# Patient Record
Sex: Female | Born: 1970 | Race: White | Hispanic: No | Marital: Married | Smoking: Never smoker
Health system: Southern US, Community
[De-identification: ages and names within clinical notes are randomized; demographics above are authoritative.]

## PROBLEM LIST (undated history)

## (undated) DIAGNOSIS — R112 Nausea with vomiting, unspecified: Secondary | ICD-10-CM

## (undated) DIAGNOSIS — D6800 Von Willebrand disease, unspecified: Secondary | ICD-10-CM

## (undated) DIAGNOSIS — Z9889 Other specified postprocedural states: Secondary | ICD-10-CM

## (undated) DIAGNOSIS — T7840XA Allergy, unspecified, initial encounter: Secondary | ICD-10-CM

## (undated) DIAGNOSIS — N3010 Interstitial cystitis (chronic) without hematuria: Secondary | ICD-10-CM

## (undated) DIAGNOSIS — E669 Obesity, unspecified: Secondary | ICD-10-CM

## (undated) DIAGNOSIS — K8020 Calculus of gallbladder without cholecystitis without obstruction: Secondary | ICD-10-CM

## (undated) DIAGNOSIS — M1990 Unspecified osteoarthritis, unspecified site: Secondary | ICD-10-CM

## (undated) DIAGNOSIS — J45909 Unspecified asthma, uncomplicated: Secondary | ICD-10-CM

## (undated) DIAGNOSIS — C801 Malignant (primary) neoplasm, unspecified: Secondary | ICD-10-CM

## (undated) DIAGNOSIS — K859 Acute pancreatitis without necrosis or infection, unspecified: Secondary | ICD-10-CM

## (undated) DIAGNOSIS — I10 Essential (primary) hypertension: Secondary | ICD-10-CM

## (undated) DIAGNOSIS — D649 Anemia, unspecified: Secondary | ICD-10-CM

## (undated) DIAGNOSIS — J189 Pneumonia, unspecified organism: Secondary | ICD-10-CM

## (undated) DIAGNOSIS — K219 Gastro-esophageal reflux disease without esophagitis: Secondary | ICD-10-CM

## (undated) HISTORY — PX: TUBAL LIGATION: SHX77

## (undated) HISTORY — DX: Obesity, unspecified: E66.9

## (undated) HISTORY — DX: Anemia, unspecified: D64.9

## (undated) HISTORY — DX: Unspecified osteoarthritis, unspecified site: M19.90

## (undated) HISTORY — DX: Allergy, unspecified, initial encounter: T78.40XA

## (undated) HISTORY — DX: Interstitial cystitis (chronic) without hematuria: N30.10

## (undated) HISTORY — DX: Calculus of gallbladder without cholecystitis without obstruction: K80.20

## (undated) HISTORY — DX: Acute pancreatitis without necrosis or infection, unspecified: K85.90

## (undated) HISTORY — DX: Essential (primary) hypertension: I10

## (undated) HISTORY — PX: OTHER SURGICAL HISTORY: SHX169

## (undated) HISTORY — DX: Unspecified asthma, uncomplicated: J45.909

## (undated) HISTORY — PX: CHOLECYSTECTOMY: SHX55

## (undated) HISTORY — PX: MELANOMA EXCISION: SHX5266

---

## 1998-03-15 ENCOUNTER — Other Ambulatory Visit: Admission: RE | Admit: 1998-03-15 | Discharge: 1998-03-15 | Payer: Self-pay | Admitting: Obstetrics and Gynecology

## 1999-03-27 ENCOUNTER — Other Ambulatory Visit: Admission: RE | Admit: 1999-03-27 | Discharge: 1999-03-27 | Payer: Self-pay | Admitting: Obstetrics and Gynecology

## 2000-05-29 ENCOUNTER — Other Ambulatory Visit: Admission: RE | Admit: 2000-05-29 | Discharge: 2000-05-29 | Payer: Self-pay | Admitting: Obstetrics and Gynecology

## 2001-06-01 ENCOUNTER — Other Ambulatory Visit: Admission: RE | Admit: 2001-06-01 | Discharge: 2001-06-01 | Payer: Self-pay | Admitting: Obstetrics and Gynecology

## 2002-06-23 ENCOUNTER — Other Ambulatory Visit: Admission: RE | Admit: 2002-06-23 | Discharge: 2002-06-23 | Payer: Self-pay | Admitting: Obstetrics and Gynecology

## 2003-07-04 ENCOUNTER — Other Ambulatory Visit: Admission: RE | Admit: 2003-07-04 | Discharge: 2003-07-04 | Payer: Self-pay | Admitting: Obstetrics and Gynecology

## 2003-10-12 ENCOUNTER — Other Ambulatory Visit: Admission: RE | Admit: 2003-10-12 | Discharge: 2003-10-12 | Payer: Self-pay | Admitting: Obstetrics and Gynecology

## 2004-08-15 ENCOUNTER — Inpatient Hospital Stay (HOSPITAL_COMMUNITY): Admission: AD | Admit: 2004-08-15 | Discharge: 2004-08-15 | Payer: Self-pay | Admitting: Obstetrics and Gynecology

## 2004-08-21 ENCOUNTER — Other Ambulatory Visit: Admission: RE | Admit: 2004-08-21 | Discharge: 2004-08-21 | Payer: Self-pay | Admitting: Obstetrics and Gynecology

## 2004-12-28 ENCOUNTER — Ambulatory Visit (HOSPITAL_COMMUNITY): Admission: RE | Admit: 2004-12-28 | Discharge: 2004-12-28 | Payer: Self-pay | Admitting: Obstetrics and Gynecology

## 2005-03-12 ENCOUNTER — Inpatient Hospital Stay (HOSPITAL_COMMUNITY): Admission: AD | Admit: 2005-03-12 | Discharge: 2005-03-15 | Payer: Self-pay | Admitting: Obstetrics and Gynecology

## 2005-04-29 ENCOUNTER — Other Ambulatory Visit: Admission: RE | Admit: 2005-04-29 | Discharge: 2005-04-29 | Payer: Self-pay | Admitting: Obstetrics and Gynecology

## 2006-07-25 ENCOUNTER — Encounter: Admission: RE | Admit: 2006-07-25 | Discharge: 2006-07-25 | Payer: Self-pay | Admitting: Obstetrics and Gynecology

## 2006-08-26 ENCOUNTER — Encounter: Admission: RE | Admit: 2006-08-26 | Discharge: 2006-08-26 | Payer: Self-pay | Admitting: Obstetrics and Gynecology

## 2007-08-07 ENCOUNTER — Encounter: Admission: RE | Admit: 2007-08-07 | Discharge: 2007-08-07 | Payer: Self-pay | Admitting: Obstetrics and Gynecology

## 2007-08-14 ENCOUNTER — Encounter: Admission: RE | Admit: 2007-08-14 | Discharge: 2007-08-14 | Payer: Self-pay | Admitting: Obstetrics and Gynecology

## 2008-08-19 ENCOUNTER — Encounter: Admission: RE | Admit: 2008-08-19 | Discharge: 2008-08-19 | Payer: Self-pay | Admitting: Obstetrics and Gynecology

## 2009-02-16 ENCOUNTER — Encounter (INDEPENDENT_AMBULATORY_CARE_PROVIDER_SITE_OTHER): Payer: Self-pay | Admitting: Obstetrics and Gynecology

## 2009-02-16 ENCOUNTER — Ambulatory Visit (HOSPITAL_COMMUNITY): Admission: RE | Admit: 2009-02-16 | Discharge: 2009-02-16 | Payer: Self-pay | Admitting: Obstetrics and Gynecology

## 2009-09-12 ENCOUNTER — Encounter: Admission: RE | Admit: 2009-09-12 | Discharge: 2009-09-12 | Payer: Self-pay | Admitting: Obstetrics and Gynecology

## 2009-09-19 ENCOUNTER — Encounter: Admission: RE | Admit: 2009-09-19 | Discharge: 2009-09-19 | Payer: Self-pay | Admitting: Obstetrics and Gynecology

## 2010-03-23 ENCOUNTER — Encounter: Admission: RE | Admit: 2010-03-23 | Discharge: 2010-03-23 | Payer: Self-pay | Admitting: Obstetrics and Gynecology

## 2010-07-08 ENCOUNTER — Encounter: Payer: Self-pay | Admitting: Obstetrics and Gynecology

## 2010-07-27 ENCOUNTER — Other Ambulatory Visit: Payer: Self-pay | Admitting: Obstetrics and Gynecology

## 2010-07-27 DIAGNOSIS — Z1231 Encounter for screening mammogram for malignant neoplasm of breast: Secondary | ICD-10-CM

## 2010-09-14 ENCOUNTER — Ambulatory Visit
Admission: RE | Admit: 2010-09-14 | Discharge: 2010-09-14 | Disposition: A | Discharge status: Home / Self Care | Payer: PRIVATE HEALTH INSURANCE | Source: Ambulatory Visit | Attending: Obstetrics and Gynecology | Admitting: Obstetrics and Gynecology

## 2010-09-14 DIAGNOSIS — Z1231 Encounter for screening mammogram for malignant neoplasm of breast: Secondary | ICD-10-CM

## 2010-09-21 LAB — CBC
MCV: 86.1 fL (ref 78.0–100.0)
RBC: 5.08 MIL/uL (ref 3.87–5.11)
WBC: 6.9 10*3/uL (ref 4.0–10.5)

## 2010-10-30 NOTE — Op Note (Signed)
Ann Ramos, Ann Ramos                 ACCOUNT NO.:  [id number]   MEDICAL RECORD NO.:  [id number]          PATIENT TYPE:  AMB   LOCATION:  SDC                           FACILITY:  [facility]   PHYSICIAN:  Dineen Kid. Rana Snare, M.D.    DATE OF BIRTH:  06/02/1971   DATE OF PROCEDURE:  02/16/2009  DATE OF DISCHARGE:                               OPERATIVE REPORT   PREOPERATIVE DIAGNOSES:  Menorrhagia, multiparity with desired  sterility.   POSTOPERATIVE DIAGNOSES:  Menorrhagia, multiparity with desired  sterility.   PROCEDURES:  Intrauterine device removal, laparoscopic tubal ligation  with Filshie clips, hysteroscopy, dilatation and curettage with  polypectomy and NovaSure endometrial ablation.   SURGEON:  Dineen Kid. Rana Snare, MD   ANESTHESIA:  General endotracheal.   INDICATIONS:  Ann Ramos is a 40 year old multipara white female with no  further childbearing desires, currently with an IUD.  She has had  histories of menorrhagia and recurrent endometrial polyps.  Currently,  she has a IUD and is requiring 2 a day birth control pills to control  her bleeding.  She desires definitive surgical intervention and requests  tubal ligation and NovaSure endometrial ablation.  Also once her IUD  removed, an evaluation of the endometrial lining and the risks and  benefits of procedure were discussed at length which include, but not  limited to risk of infection, bleeding, damage to the uterus, tubes,  ovary, bowel, bladder and the possibility of the bleeding may not  improve, it could recur or worsen, risk of tubal failure quoted at  10/998.  We also discussed the reported success and complication rates  and failure rates of the NovaSure ablation.  She gives her informed  consent and wished to proceed.   FINDINGS AT THE TIME OF SURGERY:  Normal-appearing uterus, fallopian  tubes and ovaries, normal-appearing liver.  On hysteroscopy, we have a  endometrial polyp.  Otherwise normal appearing endometrial  cavity.   DESCRIPTION OF PROCEDURE:  After adequate analgesia, the patient was  placed in the dorsal lithotomy position.  She was sterilely prepped and  draped.  Bladder was sterilely drained.  IUD string was easily grasped.  The IUD was easily removed.  A Hulka tenaculum was placed in the cervix.  A 1 cm infraumbilical skin incision was made.  A Veress needle was  inserted.  The abdomen was insufflated with dullness to percussion.  An  11-mm trocar was inserted.  The above findings were noted by the  laparoscope.  The right and left fallopian tubes were identified by the  fimbriated end and Filshie clip was placed across the midportion of the  fallopian tubes with good placement noted across the entirety of the  tube.  Reexamination of both fallopian tubes revealed good placement  with complete occlusion of the fallopian tubes.  The abdomen was then  desufflated.  Trocars removed.  The infraumbilical skin incision was  closed with a 0 Vicryl interrupted suture and the fascia, 3-0 Vicryl  Rapide subcuticular suture.  The incision was then injected with 0.25%  Marcaine, total of 10 mL used.  The legs  were repositioned.  Speculum  was placed in the vagina.  The Hulka tenaculum was removed.  A single-  tooth tenaculum was placed on the anterior lip of the cervix.  The  cervix was dilated to a #27 Pratt dilator.  Hysteroscope was inserted  and the above findings were noted.  Polyp forceps were used to grasp and  remove the endometrial polyp, followed by gentle sharp curettage.  Reexamination of the hysteroscope revealed normal-appearing cavity.  The  NovaSure device was inserted with a cavity length of 5.5 cm, cervical  length of 3.5 cm, a cavity width of 4.3 cm, easily passed a cavity  assessment test.  The NovaSure device has been activated for 1 minute  and 2 seconds at a power of 130 watts.  The device was then removed.  Reexamination with the hysteroscope revealed good thermal cautery   effect.  No obvious complications or injuries noted.  The device was  then removed.  Tenaculum removed from the cervix.  The patient was then  transferred to recovery room in stable condition.  Sponge and instrument  counts were normal x3.  Estimated blood loss was minimal.  Saline  deficit 100 mL.  The patient received 1 g of cefotetan preoperatively  and 30 mg Toradol postoperatively.   DISPOSITION:  The patient will be discharged home and follow up in the  office in 2-3 weeks.  Sent home with a routine instruction sheet for D  and C, NovaSure and laparoscopy.  I told to return with problems of  increased pain, fever or bleeding.  Also sent home with a prescription  for Tylox #20.      Dineen Kid Rana Snare, M.D.  Electronically Signed     DCL/MEDQ  D:  02/16/2009  T:  02/16/2009  Job:  [id number]

## 2011-08-19 ENCOUNTER — Other Ambulatory Visit: Payer: Self-pay | Admitting: Obstetrics and Gynecology

## 2011-08-19 DIAGNOSIS — Z1231 Encounter for screening mammogram for malignant neoplasm of breast: Secondary | ICD-10-CM

## 2011-11-19 ENCOUNTER — Ambulatory Visit
Admission: RE | Admit: 2011-11-19 | Discharge: 2011-11-19 | Disposition: A | Discharge status: Home / Self Care | Payer: PRIVATE HEALTH INSURANCE | Source: Ambulatory Visit | Attending: Obstetrics and Gynecology | Admitting: Obstetrics and Gynecology

## 2011-11-19 DIAGNOSIS — Z1231 Encounter for screening mammogram for malignant neoplasm of breast: Secondary | ICD-10-CM

## 2012-04-03 ENCOUNTER — Telehealth: Payer: Self-pay | Admitting: Hematology and Oncology

## 2012-04-03 NOTE — Telephone Encounter (Signed)
C/D 04/03/12 for appt.04/29/12

## 2012-04-29 ENCOUNTER — Ambulatory Visit: Payer: PRIVATE HEALTH INSURANCE

## 2012-04-29 ENCOUNTER — Ambulatory Visit (HOSPITAL_BASED_OUTPATIENT_CLINIC_OR_DEPARTMENT_OTHER): Payer: PRIVATE HEALTH INSURANCE | Admitting: Lab

## 2012-04-29 ENCOUNTER — Telehealth: Payer: Self-pay | Admitting: Hematology and Oncology

## 2012-04-29 ENCOUNTER — Encounter: Payer: Self-pay | Admitting: Hematology and Oncology

## 2012-04-29 ENCOUNTER — Ambulatory Visit (HOSPITAL_BASED_OUTPATIENT_CLINIC_OR_DEPARTMENT_OTHER): Payer: PRIVATE HEALTH INSURANCE | Admitting: Hematology and Oncology

## 2012-04-29 VITALS — BP 126/82 | HR 97 | Temp 98.6°F | Resp 20 | Ht 64.5 in | Wt 168.4 lb

## 2012-04-29 DIAGNOSIS — D699 Hemorrhagic condition, unspecified: Secondary | ICD-10-CM | POA: Insufficient documentation

## 2012-04-29 LAB — CBC WITH DIFFERENTIAL/PLATELET
Eosinophils Absolute: 0.1 10*3/uL (ref 0.0–0.5)
HCT: 42.3 % (ref 34.8–46.6)
LYMPH%: 30 % (ref 14.0–49.7)
MONO#: 0.4 10*3/uL (ref 0.1–0.9)
NEUT#: 4.6 10*3/uL (ref 1.5–6.5)
NEUT%: 63.6 % (ref 38.4–76.8)
Platelets: 193 10*3/uL (ref 145–400)
WBC: 7.3 10*3/uL (ref 3.9–10.3)

## 2012-04-29 NOTE — Progress Notes (Signed)
Checked in new pt w/ no financial concerns. °

## 2012-04-29 NOTE — Patient Instructions (Addendum)
Ann Ramos  [id number]   [facility] - AFTER VISIT SUMMARY   **RECOMMENDATIONS MADE BY THE CONSULTANT AND ANY TEST    RESULTS WILL BE SENT TO YOUR REFERRING DOCTORS.   YOUR EXAM FINDINGS, LABS AND RESULTS WERE DISCUSSED BY YOUR MD TODAY.  YOU CAN GO TO THE [facility] WEB SITE FOR INSTRUCTIONS ON HOW TO ASSESS MY CHART FOR ADDITIONAL INFORMATION AS NEEDED.  Your Updated drug allergies are: Allergies as of 04/29/2012  . (Not on File)    Your current list of medications are: Current Outpatient Prescriptions  Medication Sig Dispense Refill  . doxycycline (ADOXA) 100 MG tablet Take 100 mg by mouth daily.      . fexofenadine-pseudoephedrine (ALLEGRA-D 24) 180-240 MG per 24 hr tablet Take 1 tablet by mouth as needed.      Marland Kitchen lisinopril (PRINIVIL,ZESTRIL) 20 MG tablet Take 20 mg by mouth daily.      . mometasone (NASONEX) 50 MCG/ACT nasal spray Place 2 sprays into the nose daily.         INSTRUCTIONS GIVEN AND DISCUSSED:  See attached schedule   SPECIAL INSTRUCTIONS/FOLLOW-UP:  See above.  I acknowledge that I have been informed and understand all the instructions given to me and received a copy.I know to contact the clinic, my physician, or go to the emergency Department if any problems should occur.   I do not have any more questions at this time, but understand that I may call the [facility] at [phone] during business hours should I have any further questions or need assistance in obtaining follow-up care.

## 2012-04-29 NOTE — Progress Notes (Signed)
OB/GYN MD: Dr. Rana Snare  Primary MD: Dr. Sherral Hammers [facility])  Ok to release information to:  Emera Spath (spouse) Dion Saucier (mother)  Ok to leave voicemail messages.  Preferred contact number: [phone] (cell)

## 2012-04-29 NOTE — Telephone Encounter (Signed)
gv and printed pt appt schedule for Dec °

## 2012-04-29 NOTE — Progress Notes (Signed)
This office note has been dictated.

## 2012-04-30 NOTE — Progress Notes (Signed)
CC:   Dineen Kid. Rana Snare, M.D.  IDENTIFYING STATEMENT:  The patient is a 41 year old woman seen at the request of Dr. Rana Snare with a bleeding diathesis.  HISTORY OF PRESENT ILLNESS:  The patient reports that over the years she has had heavy menstrual cycles, even despite receiving an endometrial ablation 2 years ago.  She is presently on the oral contraceptive pill to help control bleeding.  She also recalls that she had bleeding following wisdom tooth extraction at age 14.  She has a gallbladder surgery done laparoscopically but noted minimal bleeding at that time.  She bruises easily.  Her grandmother was noted to be a bleeder.  She describes her grandmother bleeding quite heavily following a hysterectomy.  Her brother suffers from nosebleeds.  Neither have been worked up for a bleeding diathesis.  PAST MEDICAL HISTORY: 1. Hypertension. 2. Status post endometrial ablation for menorrhagia. 3. Status post laparoscopic cholecystectomy in 1996. 4. History of acne rosacea. 5. Status post wisdom tooth extraction as a child.  ALLERGIES:  None.  MEDICATIONS:  Doxycycline 100 mg daily, Allegra-D 1 tablet as needed, lisinopril 20 mg daily, Nasonex 2 sprays daily.  ALLERGIES:  None.  SOCIAL HISTORY:  The patient is married with 2 children ages 59 and 34. Denies alcohol or tobacco use.  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  FAMILY HISTORY:  Patient's maternal grandmother was a bleeder.  Brother has a history of epistaxis.  Mother and maternal aunt both diagnosed with breast cancer.  HEALTH MAINTENANCE:  She is up-to-date with mammograms.  REVIEW OF SYSTEMS:  Denies fever, chills, night sweats, anorexia, weight loss.  GI:  Denies nausea, vomiting, abdominal pain, diarrhea, melena, hematochezia.  GU:  Denies dysuria, hematuria, nocturia, frequency. Skin:  No bruising or bleeding.  Musculoskeletal:  Denies joint aches, muscle pains.  Neurologic:  Denies  headaches, vision changes, extremity weakness.  The rest of the review of systems are negative.  PHYSICAL EXAM:  Patient is alert and oriented x3.  Vitals:  Pulse 97, blood pressure 124/82, temperature 98.6, respirations 20.  Weight 168.4 pounds.  HEENT:  Head is atraumatic, normocephalic.  Sclerae anicteric. Mouth moist.  Neck:  Supple.  Chest:  Clear to percussion and auscultation.  CVS:  First and second heart sounds present with no added sounds or murmurs.  Abdomen:  Soft, nontender.  No masses.  Bowel sounds present.  Extremities:  No edema.  Skin:  No bruising.  Lymph nodes:  No palpable adenopathy.  CNS:  Nonfocal.  IMPRESSION AND PLAN:  Mrs. Mcclary is a 41 year old woman who appears to have a bleeding diathesis with menorrhagia and also noted bleeding following wisdom tooth extraction at age 89.  She has also noted she bleeds easily.  She has a family history that is significant in a grandmother and a brother that has epistaxis.  She also reports a past history for anemia.  So with this said, she will have a bleeding diathesis workup performed.  We will also include a coagulation panel and a von Willebrand's panel.  It would also be worthwhile to obtain a CBC with differential and an anemia panel.  We spent most of the time discussing likely causes of bleeding diathesis. She returns to discuss results.  I spent more than half the time coordinating care.    ______________________________ Laurice Record, M.D. LIO/MEDQ  D:  04/29/2012  T:  04/30/2012  Job:  [id number]

## 2012-05-05 ENCOUNTER — Telehealth: Payer: Self-pay | Admitting: *Deleted

## 2012-05-05 ENCOUNTER — Other Ambulatory Visit: Payer: Self-pay | Admitting: Hematology and Oncology

## 2012-05-05 DIAGNOSIS — D699 Hemorrhagic condition, unspecified: Secondary | ICD-10-CM

## 2012-05-05 LAB — VON WILLEBRAND FACTOR MULTIMER

## 2012-05-05 LAB — IRON AND TIBC
Iron: 62 ug/dL (ref 42–145)
UIBC: 267 ug/dL (ref 125–400)

## 2012-05-05 NOTE — Telephone Encounter (Signed)
Spoke with pt and gave pt date and time for lab at 215 pm 05/06/12.

## 2012-05-06 ENCOUNTER — Other Ambulatory Visit (HOSPITAL_BASED_OUTPATIENT_CLINIC_OR_DEPARTMENT_OTHER): Payer: PRIVATE HEALTH INSURANCE | Admitting: Lab

## 2012-05-06 DIAGNOSIS — D699 Hemorrhagic condition, unspecified: Secondary | ICD-10-CM

## 2012-05-06 LAB — IVY BLEEDING TIME: Bleeding Time: 4.5 Minutes (ref 2.0–8.0)

## 2012-05-07 LAB — PLATELET FUNCTION ASSAY

## 2012-05-09 LAB — FACTOR 9 ASSAY: Coagulation Factor IX: 143 % — ABNORMAL HIGH (ref 75–134)

## 2012-05-21 ENCOUNTER — Encounter: Payer: Self-pay | Admitting: Hematology and Oncology

## 2012-05-21 ENCOUNTER — Telehealth: Payer: Self-pay | Admitting: Hematology and Oncology

## 2012-05-21 ENCOUNTER — Telehealth: Payer: Self-pay | Admitting: *Deleted

## 2012-05-21 ENCOUNTER — Ambulatory Visit (HOSPITAL_BASED_OUTPATIENT_CLINIC_OR_DEPARTMENT_OTHER): Payer: PRIVATE HEALTH INSURANCE | Admitting: Hematology and Oncology

## 2012-05-21 VITALS — BP 126/89 | HR 101 | Temp 97.6°F | Resp 20 | Ht 64.5 in | Wt 165.6 lb

## 2012-05-21 DIAGNOSIS — D699 Hemorrhagic condition, unspecified: Secondary | ICD-10-CM

## 2012-05-21 DIAGNOSIS — D680 Von Willebrand's disease: Secondary | ICD-10-CM

## 2012-05-21 NOTE — Patient Instructions (Addendum)
Ann Ramos  [id number]   [facility] - AFTER VISIT SUMMARY   **RECOMMENDATIONS MADE BY THE CONSULTANT AND ANY TEST    RESULTS WILL BE SENT TO YOUR REFERRING DOCTORS.   YOUR EXAM FINDINGS, LABS AND RESULTS WERE DISCUSSED BY YOUR MD TODAY.  YOU CAN GO TO THE [facility] WEB SITE FOR INSTRUCTIONS ON HOW TO ASSESS MY CHART FOR ADDITIONAL INFORMATION AS NEEDED.  Your Updated drug allergies are: Allergies as of 05/21/2012  . (Not on File)    Your current list of medications are: Current Outpatient Prescriptions  Medication Sig Dispense Refill  . doxycycline (ADOXA) 100 MG tablet Take 100 mg by mouth daily.      . fexofenadine-pseudoephedrine (ALLEGRA-D 24) 180-240 MG per 24 hr tablet Take 1 tablet by mouth as needed.      Marland Kitchen lisinopril (PRINIVIL,ZESTRIL) 20 MG tablet Take 20 mg by mouth daily.      . mometasone (NASONEX) 50 MCG/ACT nasal spray Place 2 sprays into the nose daily.         INSTRUCTIONS GIVEN AND DISCUSSED:  See attached schedule   SPECIAL INSTRUCTIONS/FOLLOW-UP:  See above.  I acknowledge that I have been informed and understand all the instructions given to me and received a copy.I know to contact the clinic, my physician, or go to the emergency Department if any problems should occur.   I do not have any more questions at this time, but understand that I may call the [facility] at [phone] during business hours should I have any further questions or need assistance in obtaining follow-up care.

## 2012-05-21 NOTE — Progress Notes (Signed)
This office note has been dictated.

## 2012-05-21 NOTE — Telephone Encounter (Signed)
Per staff and POF I have scheduled appt.   JMW

## 2012-05-21 NOTE — Telephone Encounter (Signed)
appts made and printed for pt aom °

## 2012-05-22 NOTE — Progress Notes (Signed)
CC:   Dineen Kid. Rana Snare, M.D. Keturah Barre, MD  HISTORY OF PRESENT ILLNESS:  The a 41 year old woman who presents to discuss results.  INTERVAL HISTORY:  To summarize, the patient was referred for a bleeding diathesis.  Had reported heavy menstrual cycles over the years, even despite receiving endometrial ablations.  She is currently on the oral contraceptive pill to help control bleeding.  Had recalled bleeding following wisdom tooth extraction.  She notes bruising easily.  Her grandmother was also a bleeder.  Her brother and nephew both have nose bleeds.  Results note the following:  04/29/2012 hemoglobin and hematocrit 14.6 and 42.3, respectively.  Von Willebrand factor was normal at 73%. Ristocetin cofactor normal at 53%.  Von Willebrand multimers were all present in normal amounts.  Bleeding time 4.5.  Ferritin 153.  PT 14.9, INR 1.18, PTT elevated at 40.  Factor VIII coagulation assay was low at 34.  MEDICATIONS:  Reviewed and updated.  ALLERGIES:  None.  PAST MEDICAL HISTORY/FAMILY HISTORY/SOCIAL HISTORY:  Unchanged.  REVIEW OF SYSTEMS:  Besides easy bruisability, the rest of review of systems negative.  PHYSICAL EXAM:  General:  The patient is a well-appearing, well- nourished woman in no distress.  Vitals:  Pulse 101, blood pressure 126/89, temperature 97.6, respirations 20, weight 165.6 pounds.  HEENT: Sclerae anicteric.  Mouth moist.  Chest:  Clear.  CVS:  Unremarkable. Abdomen:  Soft, nontender.  No masses.  Skin:  No bruising.  LABORATORY DATA:  As above.  In addition, white cell count 7.3, platelets 193.  IMPRESSION AND PLAN:  Ms Hemann is a 41 year old woman with history of a bleeding diathesis, and workup notes that she more than likely has Von Willebrand's disease, type 2N with a decreased coagulation factor VIII level with normal Von Willebrand antigen and multimers pattern.  She may probably benefit from DDAVP, but before I give a firm recommendation,  I would like for her to undergo DDAVP trial.  I have her returning next week.  She will receive 0.3 mg/kg IV, and we will obtain levels an hour and 4 hours after the infusion.  If she responds well, will prescribe intranasal DDAVP.  I also recommend that she have family members undergo testing.  I will be telephoning her with the results.    ______________________________ Laurice Record, M.D. LIO/MEDQ  D:  05/21/2012  T:  05/22/2012  Job:  [id number]

## 2012-05-26 ENCOUNTER — Other Ambulatory Visit: Payer: Self-pay | Admitting: *Deleted

## 2012-05-26 ENCOUNTER — Ambulatory Visit (HOSPITAL_BASED_OUTPATIENT_CLINIC_OR_DEPARTMENT_OTHER): Payer: PRIVATE HEALTH INSURANCE

## 2012-05-26 ENCOUNTER — Other Ambulatory Visit: Payer: PRIVATE HEALTH INSURANCE | Admitting: Lab

## 2012-05-26 ENCOUNTER — Telehealth: Payer: Self-pay | Admitting: *Deleted

## 2012-05-26 ENCOUNTER — Ambulatory Visit (HOSPITAL_BASED_OUTPATIENT_CLINIC_OR_DEPARTMENT_OTHER): Payer: PRIVATE HEALTH INSURANCE | Admitting: Lab

## 2012-05-26 VITALS — BP 105/72 | HR 105 | Temp 97.9°F

## 2012-05-26 DIAGNOSIS — D699 Hemorrhagic condition, unspecified: Secondary | ICD-10-CM

## 2012-05-26 DIAGNOSIS — D680 Von Willebrand's disease: Secondary | ICD-10-CM

## 2012-05-26 MED ORDER — SODIUM CHLORIDE 0.9 % IV SOLN
24.0000 ug | Freq: Once | INTRAVENOUS | Status: AC
  Administered 2012-05-26: 24 ug via INTRAVENOUS
  Filled 2012-05-26: qty 6

## 2012-05-26 MED ORDER — DIPHENHYDRAMINE HCL 50 MG/ML IJ SOLN
25.0000 mg | Freq: Once | INTRAMUSCULAR | Status: AC
  Administered 2012-05-26: 25 mg via INTRAVENOUS

## 2012-05-26 NOTE — Telephone Encounter (Signed)
Pt was assessed post lab draw for DDAVP.  Pt's face less flushed, eyes less reddened.  Pt stated she was feeling much better at 1600.   MD gave pt instructions.   NSL was discontinued by nurse with catheter intact, site unremarkable.  Nourishment given to pt.  Pt was stable at discharge via ambulation by self.

## 2012-05-26 NOTE — Patient Instructions (Signed)
Desmopressin injection What is this medicine? DESMOPRESSIN (des moe PRESS in) is a man made hormone. It is used to treat or prevent bleeding in patients with Hemophilia A and von Willebrand's Disease. This medicine is also used to reduce urine production in patients with diabetes insipidus. This medicine may be used for other purposes; ask your health care provider or pharmacist if you have questions. What should I tell my health care provider before I take this medicine? They need to know if you have any of these conditions: -blood clot in the past -cystic fibrosis -heart disease -high blood pressure -kidney disease -low levels of sodium in the blood -an unusual or allergic reaction to desmopressin, vasopressin, other medicines, foods, dyes, or preservatives -pregnant or trying to get pregnant -breast-feeding How should I use this medicine? This medicine is infused into a vein or injected under the skin. It is usually given by a health care professional in a hospital or clinic setting. If you use this medicine at home, you will be taught how to prepare and give this medicine. Use exactly as directed. Take your medicine at regular intervals. Do not take your medicine more often than directed. It is important that you put your used needles and syringes in a special sharps container. Do not put them in a trash can. If you do not have a sharps container, call your pharmacist or healthcare provider to get one. Talk to your pediatrician regarding the use of this medicine in children. While this drug may be prescribed for selected conditions, precautions do apply. Overdosage: If you think you have taken too much of this medicine contact a poison control center or emergency room at once. NOTE: This medicine is only for you. Do not share this medicine with others. What if I miss a dose? If you miss a dose and it is almost time for your next dose, take only that dose. Do not take double or extra  doses. What may interact with this medicine? -alcohol -demeclocycline -medicines for asthma, breathing problems, colds -medicines for low blood pressure This list may not describe all possible interactions. Give your health care provider a list of all the medicines, herbs, non-prescription drugs, or dietary supplements you use. Also tell them if you smoke, drink alcohol, or use illegal drugs. Some items may interact with your medicine. What should I watch for while using this medicine? Visit your doctor or health care professional for regular check ups. You will need to have blood work done while you are taking this medicine. Talk with your doctor about how many glasses of fluid you need to drink a day. Only drink enough fluid to satisfy your thirst or as directed. Too much or not enough water can cause harm. What side effects may I notice from receiving this medicine? Side effects that you should report to your doctor or health care professional as soon as possible: -allergic reactions like skin rash, itching or hives, swelling of the face, lips, or tongue -change in blood pressure -chest pain, tightness -confusion -difficulty breathing -fast heart rate -retaining water -seizure -sudden weight gain -unusual bleeding, bruising -unusually weak or tired Side effects that usually do not require medical attention (report to your doctor or health care professional if they continue or are bothersome): -diarrhea -flushing, reddening of the skin -headache -nausea, vomiting -pain, redness where injected -stomach cramps -vaginal area pain This list may not describe all possible side effects. Call your doctor for medical advice about side effects. You may report   side effects to FDA at 1-800-FDA-1088. Where should I keep my medicine? Keep out of the reach of children. You will be instructed on how to store this medicine. Throw away any unused medicine after the expiration date on the  label. NOTE: This sheet is a summary. It may not cover all possible information. If you have questions about this medicine, talk to your doctor, pharmacist, or health care provider.  2013, [employer]. (09/24/2007 4:31:00 PM)  

## 2012-05-26 NOTE — Progress Notes (Signed)
1140-DDAVP hung at 1115.  At 1140, facial flushing noted on patient.  Dr. Dalene Carrow notified and order received to give Benadryl 25 mg. IV.  Pt states that face feels "hot".  1245-Pt to labe for 1 hr post labs.  Pt verbalizes an understanding to remain in building until labs need to be drawn again at 1545.  Upon discharge from infusion room, pt.'s face still red, but "hot" sensation is gone.  Pt has no complaints at this time.  Dr. Dalene Carrow notified of pt.'s condition at time of discharge.

## 2012-05-29 LAB — VON WILLEBRAND PANEL
Coagulation Factor VIII: 128 % (ref 73–140)
Ristocetin Co-factor, Plasma: 204 % — ABNORMAL HIGH (ref 42–200)
Ristocetin Co-factor, Plasma: 190 % (ref 42–200)
Von Willebrand Antigen, Plasma: 122 % (ref 50–217)
Von Willebrand Antigen, Plasma: 202 % (ref 50–217)

## 2012-06-04 ENCOUNTER — Other Ambulatory Visit: Payer: Self-pay | Admitting: Hematology and Oncology

## 2012-06-04 DIAGNOSIS — D680 Von Willebrand's disease: Secondary | ICD-10-CM

## 2012-06-04 MED ORDER — DESMOPRESSIN ACETATE 1.5 MG/ML NA SOLN: 1.0000 | Freq: Once | NASAL | Status: DC

## 2012-07-07 ENCOUNTER — Ambulatory Visit: Payer: PRIVATE HEALTH INSURANCE | Admitting: Oncology

## 2012-07-22 ENCOUNTER — Telehealth: Payer: Self-pay | Admitting: Hematology and Oncology

## 2012-07-22 NOTE — Telephone Encounter (Signed)
LVOM for pt in re to appt 2/28 @ 10:15 @ [employer]

## 2012-11-13 ENCOUNTER — Other Ambulatory Visit: Payer: Self-pay

## 2012-11-13 DIAGNOSIS — Z1231 Encounter for screening mammogram for malignant neoplasm of breast: Secondary | ICD-10-CM

## 2012-12-29 ENCOUNTER — Ambulatory Visit
Admission: RE | Admit: 2012-12-29 | Discharge: 2012-12-29 | Disposition: A | Discharge status: Home / Self Care | Payer: PRIVATE HEALTH INSURANCE | Source: Ambulatory Visit

## 2012-12-29 DIAGNOSIS — Z1231 Encounter for screening mammogram for malignant neoplasm of breast: Secondary | ICD-10-CM

## 2012-12-31 ENCOUNTER — Other Ambulatory Visit: Payer: Self-pay | Admitting: Obstetrics and Gynecology

## 2012-12-31 DIAGNOSIS — R928 Other abnormal and inconclusive findings on diagnostic imaging of breast: Secondary | ICD-10-CM

## 2013-01-06 ENCOUNTER — Other Ambulatory Visit: Payer: Self-pay | Admitting: Obstetrics and Gynecology

## 2013-01-06 DIAGNOSIS — Z803 Family history of malignant neoplasm of breast: Secondary | ICD-10-CM

## 2013-01-12 ENCOUNTER — Ambulatory Visit
Admission: RE | Admit: 2013-01-12 | Discharge: 2013-01-12 | Disposition: A | Discharge status: Home / Self Care | Payer: PRIVATE HEALTH INSURANCE | Source: Ambulatory Visit | Attending: Obstetrics and Gynecology | Admitting: Obstetrics and Gynecology

## 2013-01-12 DIAGNOSIS — R928 Other abnormal and inconclusive findings on diagnostic imaging of breast: Secondary | ICD-10-CM

## 2013-01-20 ENCOUNTER — Other Ambulatory Visit: Payer: PRIVATE HEALTH INSURANCE

## 2013-06-07 ENCOUNTER — Other Ambulatory Visit: Payer: Self-pay | Admitting: Obstetrics and Gynecology

## 2013-06-07 DIAGNOSIS — N63 Unspecified lump in unspecified breast: Secondary | ICD-10-CM

## 2013-06-24 ENCOUNTER — Ambulatory Visit
Admission: RE | Admit: 2013-06-24 | Discharge: 2013-06-24 | Disposition: A | Discharge status: Home / Self Care | Payer: No Typology Code available for payment source | Source: Ambulatory Visit | Attending: Obstetrics and Gynecology | Admitting: Obstetrics and Gynecology

## 2013-06-24 DIAGNOSIS — N63 Unspecified lump in unspecified breast: Secondary | ICD-10-CM

## 2014-04-13 ENCOUNTER — Other Ambulatory Visit: Payer: Self-pay | Admitting: Obstetrics and Gynecology

## 2014-04-14 LAB — CYTOLOGY - PAP

## 2014-08-23 ENCOUNTER — Other Ambulatory Visit: Payer: Self-pay | Admitting: Dermatology

## 2015-01-19 ENCOUNTER — Other Ambulatory Visit: Payer: Self-pay | Admitting: Vascular Surgery

## 2015-01-23 ENCOUNTER — Other Ambulatory Visit: Payer: Self-pay | Admitting: *Deleted

## 2015-01-23 DIAGNOSIS — I7300 Raynaud's syndrome without gangrene: Secondary | ICD-10-CM

## 2015-01-23 DIAGNOSIS — M79641 Pain in right hand: Secondary | ICD-10-CM

## 2015-01-23 DIAGNOSIS — M79642 Pain in left hand: Secondary | ICD-10-CM

## 2015-01-23 DIAGNOSIS — R202 Paresthesia of skin: Secondary | ICD-10-CM

## 2015-03-16 ENCOUNTER — Encounter: Payer: Self-pay | Admitting: Surgery

## 2015-03-20 ENCOUNTER — Ambulatory Visit (INDEPENDENT_AMBULATORY_CARE_PROVIDER_SITE_OTHER): Payer: No Typology Code available for payment source | Admitting: Surgery

## 2015-03-20 ENCOUNTER — Ambulatory Visit (INDEPENDENT_AMBULATORY_CARE_PROVIDER_SITE_OTHER)
Admission: RE | Admit: 2015-03-20 | Discharge: 2015-03-20 | Disposition: A | Discharge status: Home / Self Care | Payer: No Typology Code available for payment source | Source: Ambulatory Visit | Attending: Surgery | Admitting: Surgery

## 2015-03-20 ENCOUNTER — Encounter: Payer: Self-pay | Admitting: Surgery

## 2015-03-20 ENCOUNTER — Ambulatory Visit (HOSPITAL_COMMUNITY)
Admission: RE | Admit: 2015-03-20 | Discharge: 2015-03-20 | Disposition: A | Discharge status: Home / Self Care | Payer: No Typology Code available for payment source | Source: Ambulatory Visit | Attending: Surgery | Admitting: Surgery

## 2015-03-20 VITALS — BP 131/86 | HR 83 | Temp 98.2°F | Resp 16 | Ht 65.5 in | Wt 151.0 lb

## 2015-03-20 DIAGNOSIS — M79642 Pain in left hand: Secondary | ICD-10-CM | POA: Insufficient documentation

## 2015-03-20 DIAGNOSIS — M79641 Pain in right hand: Secondary | ICD-10-CM | POA: Insufficient documentation

## 2015-03-20 DIAGNOSIS — I7300 Raynaud's syndrome without gangrene: Secondary | ICD-10-CM | POA: Insufficient documentation

## 2015-03-20 DIAGNOSIS — I10 Essential (primary) hypertension: Secondary | ICD-10-CM | POA: Diagnosis not present

## 2015-03-20 DIAGNOSIS — R202 Paresthesia of skin: Secondary | ICD-10-CM

## 2015-03-20 NOTE — Progress Notes (Signed)
Filed Vitals:   03/20/15 1202 03/20/15 1212  BP: 135/89 131/86  Pulse: 83 83  Temp: 98.2 F (36.8 C)   TempSrc: Oral   Resp: 16   Height: 5' 5.5" (1.664 m)   Weight: 151 lb (68.493 kg)   SpO2: 100%

## 2015-03-20 NOTE — Progress Notes (Signed)
Patient name: Ann Ramos MRN: [id number] DOB: [birth year] Sex: female   Referred by: Dr. Estanislado Pandy  Reason for referral:  Chief Complaint  Patient presents with  . New Evaluation    Ref. by Dr. Estanislado Pandy  C/O  Bilateral Blue hands/feet,  3 - 4 yrs, Bilat. Cramps Hand/Feet, 3 yrs.  Numbness Bilat. Hand/feet,     HISTORY OF PRESENT ILLNESS: This is a very pleasant 44 year old female who comes in today for further evaluation regarding Raynaud's phenomenon.  The patient states that she began having symptoms approximately 3-4 years ago.  There was no inciting event.  She complains of cramps which affect her bilateral lower extremities from the knee down and bilateral hands.  She does note bluish discoloration of both her hands and her feet which has become more pronounced.  She also gets tingling and a feeling of her hands being asleep.  She has undergone a full rheumatologic workup which has all been within normal limits.  This includes a CBC, sedimentation rate, CK, UA, ANCA, ACE level, rheumatoid factor, CCP, G6PD, SPEP, C3, C4, ENA, beta-2, anti-Cardiolite back, lupus anticoagulant, and cryoglobulin.  The patient is a nonsmoker.  She was recently started on a low-dose calcium channel blocker approximately 2 weeks ago which has not had significant benefit.  She has tried a full dose calcium channel blocker in the past and had significant bilateral lower extremity edema.  She is a nonsmoker.  She does not have a prior history nor a family history of autoimmune disease.  She is medically managed for arthritis and anemia.  Past Medical History  Diagnosis Date  . Allergy   . Anemia   . Arthritis   . Asthma   . Hypertension     Past Surgical History  Procedure Laterality Date  . Cholecystectomy    . Dilation and cutterege      polys  . Uterine ablation    . Tubal ligation      Social History   Social History  . Marital Status: Married    Spouse Name: N/A  . Number of  Children: N/A  . Years of Education: N/A   Occupational History  . Not on file.   Social History Main Topics  . Smoking status: Never Smoker   . Smokeless tobacco: Never Used  . Alcohol Use: Yes     Comment: Occasional alcohol  . Drug Use: No  . Sexual Activity: Not on file   Other Topics Concern  . Not on file   Social History Narrative    Family History  Problem Relation Age of Onset  . Cancer Mother     Breast   . Hyperlipidemia Mother   . Diabetes Father   . Hyperlipidemia Father   . Hypertension Father   . Varicose Veins Father     Allergies as of 03/20/2015  . (No Known Allergies)    Current Outpatient Prescriptions on File Prior to Visit  Medication Sig Dispense Refill  . fexofenadine-pseudoephedrine (ALLEGRA-D 24) 180-240 MG per 24 hr tablet Take 1 tablet by mouth as needed.    . phentermine (ADIPEX-P) 37.5 MG tablet Take 37.5 mg by mouth daily before breakfast.    . desmopressin (STIMATE) 1.5 MG/ML SOLN Place 1 spray (0.15 mg total) into the nose once. On the first day of each menstrual cycle. (Patient not taking: Reported on 03/20/2015) 1 Bottle 0  . doxycycline (ADOXA) 100 MG tablet Take 100 mg by mouth daily.    Marland Kitchen  lisinopril (PRINIVIL,ZESTRIL) 20 MG tablet Take 20 mg by mouth daily.    . mometasone (NASONEX) 50 MCG/ACT nasal spray Place 2 sprays into the nose daily. Both nostrils.     No current facility-administered medications on file prior to visit.     REVIEW OF SYSTEMS: Cardiovascular: No chest pain.  Positive for chest pressure and palpitations, pain in legs and walking and lying flat, leg swelling and varicose veins. Pulmonary: No productive cough, asthma or wheezing. Neurologic: Positive for numbness in both hands and feet No dizziness. Hematologic: Positive for bleeding problems Musculoskeletal: No joint pain or joint swelling. Gastrointestinal: No blood in stool or hematemesis Genitourinary: No dysuria or hematuria. Psychiatric:: No history  of major depression. Integumentary: No rashes or ulcers. Constitutional: No fever positive chills.  PHYSICAL EXAMINATION:  Filed Vitals:   03/20/15 1202 03/20/15 1212  BP: 135/89 131/86  Pulse: 83 83  Temp: 98.2 F (36.8 C)   TempSrc: Oral   Resp: 16   Height: 5' 5.5" (1.664 m)   Weight: 151 lb (68.493 kg)   SpO2: 100%    Body mass index is 24.74 kg/(m^2). General: The patient appears their stated age.   HEENT:  No gross abnormalities Pulmonary: Respirations are non-labored Musculoskeletal: There are no major deformities.   Neurologic: No focal weakness or paresthesias are detected, Skin: Profound bluish appearance of bilateral feet and bilateral hands Psychiatric: The patient has normal affect. Cardiovascular: There is a regular rate and rhythm without significant murmur appreciated.  No carotid bruits.  Palpable pedal pulses.  Palpable radial pulses.  Diagnostic Studies: Lower extremity Doppler studies were performed today.  Ankle brachial index on the right is 1.1.  On the left is 1.09.  Both with triphasic waveforms. Bilateral upper extremity and digital pressures.  There was mild dampening in bilateral digital PPG waveforms throughout the digits at rest and after warming   Assessment:  Raynaud's phenomenon Plan: Vascular lab testing as well as physical exam today does not show an obstructive component to her Raynaud's.  Therefore, I think she is going to be best managed medically.  She has already started a calcium channel blocker at low dose.  Hopefully this dose can be increased so that she might see some of the benefits.  From a vascular standpoint, there are no obstructive lesions that could be treated.  I think this is all in the smaller vessels which is dependent upon medical therapy.     Eldridge Abrahams, M.D. [facility] Office: [phone] Pager:  [phone]

## 2015-03-29 ENCOUNTER — Encounter: Payer: Self-pay | Admitting: Rheumatology

## 2016-05-23 ENCOUNTER — Ambulatory Visit: Payer: Self-pay | Admitting: Rheumatology

## 2016-06-26 NOTE — Progress Notes (Signed)
Office Visit Note  Patient: Ann Ramos             Date of Birth: 12/29/1970           MRN: [id number]             PCP: Luz Lex, MD Referring: Louretta Shorten, MD Visit Date: 06/27/2016 Occupation: Members service representative    Subjective:  Raynauds   History of Present Illness: Ann Ramos is a 46 y.o. female with history of Raynaud's phenomenon. She states some that she still continues to have some symptoms of Raynauds but on the medications. She denies any digital ulcers.  Activities of Daily Living:  Patient reports morning stiffness for0 minute.   Patient Denies nocturnal pain.  Difficulty dressing/grooming: Denies Difficulty climbing stairs: Denies Difficulty getting out of chair: Denies Difficulty using hands for taps, buttons, cutlery, and/or writing: Denies   Review of Systems  Constitutional: Negative for fatigue, night sweats, weight gain, weight loss and weakness.  HENT: Negative for mouth sores, trouble swallowing, trouble swallowing, mouth dryness and nose dryness.   Eyes: Negative for pain, redness, visual disturbance and dryness.  Respiratory: Negative for cough, shortness of breath and difficulty breathing.   Cardiovascular: Negative for chest pain, palpitations, hypertension, irregular heartbeat and swelling in legs/feet.  Gastrointestinal: Negative for blood in stool, constipation and diarrhea.  Endocrine: Negative for increased urination.  Genitourinary: Negative for vaginal dryness.  Musculoskeletal: Negative for arthralgias, joint pain, joint swelling, myalgias, muscle weakness, morning stiffness, muscle tenderness and myalgias.  Skin: Positive for color change. Negative for rash, hair loss, skin tightness, ulcers and sensitivity to sunlight.  Allergic/Immunologic: Negative for susceptible to infections.  Neurological: Negative for dizziness, memory loss and night sweats.  Hematological: Negative for swollen glands.  Psychiatric/Behavioral:  Negative for depressed mood and sleep disturbance. The patient is not nervous/anxious.     PMFS History:  Patient Active Problem List   Diagnosis Date Noted  . Essential hypertension 06/27/2016  . Environmental allergies 06/27/2016  . Rosacea 06/27/2016  . Raynaud's disease without gangrene 06/27/2016  . 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 Allergy   . Anemia   . Arthritis   . Asthma   . Hypertension     Family History  Problem Relation Age of Onset  . Cancer Mother     Breast   . Hyperlipidemia Mother   . Diabetes Father   . Hyperlipidemia Father   . Hypertension Father   . Varicose Veins Father    Past Surgical History:  Procedure Laterality Date  . CHOLECYSTECTOMY    . Dilation and Cutterege     polys  . TUBAL LIGATION    . uterine ablation     Social History   Social History Narrative  . No narrative on file     Objective: Vital Signs: BP 110/74   Pulse 82   Resp 14   Ht _0  (1.651 m)   Wt 175 lb (79.4 kg)   LMP 06/18/2016 (Exact Date)   BMI 29.12 kg/m    Physical Exam  Constitutional: She is oriented to person, place, and time. She appears well-developed and well-nourished.  HENT:  Head: Normocephalic and atraumatic.  Eyes: Conjunctivae and EOM are normal.  Neck: Normal range of motion.  Cardiovascular: Normal rate, regular rhythm, normal heart sounds and intact distal pulses.   Pulmonary/Chest: Effort normal and breath sounds normal.  Abdominal: Soft. Bowel sounds are normal.  Lymphadenopathy:    She has no cervical adenopathy.  Neurological: She is alert and oriented to person, place, and time.  Skin: Skin is warm and dry. Capillary refill takes less than 2 seconds.  Psychiatric: She has a normal mood and affect. Her behavior is normal.  Nursing note and vitals reviewed.    Musculoskeletal Exam: C-spine, thoracic, lumbar spine good range of motion.  Shoulder joints, elbow joints, wrist joints, MCPs, PIPs, DIPs good range of motion with no synovitis. Hip joints, knee joints, ankle joints, MTPs were good range of motion with no synovitis.  CDAI Exam: No CDAI exam completed.    Investigation: Findings:  10/03/2015 CBC normal, CMP normal, ESR negative, ENA negative, cryoglobulins negative, UA negative, 1, ANA negative, C3-C4 normal, beta-2 negative, anticardiolipin negative, lupus anticoagulant negative    Imaging: No results found.  Speciality Comments: No specialty comments available.    Procedures:  No procedures performed Allergies: Patient has no known allergies.   Assessment / Plan:     Visit Diagnoses: Raynaud's disease without gangrene - On losartan, sildenafil 20 mg twice a day she is doing much better on the current combination of medications. Her hands were warm to touch. She had no digital ulcers on examination. I do not notice any skin thickening she has no other clinical features of autoimmune disease. I will check some additional labs today. She's been complaining of myalgias also check CK.- Plan: CBC with Differential/Platelet, COMPLETE METABOLIC PANEL WITH GFR, Urinalysis, Routine w reflex microscopic, Rheumatoid factor, Cyclic citrul peptide antibody, IgG, Anti-DNA antibody, double-stranded, ANA, CK, Pan-ANCA  Von Willebrands disease ([facility]) - Avoid aspirin  Essential hypertension  Environmental allergies  Rosacea - Followed up by Dr. Derrel Nip    Orders: Orders Placed This Encounter  Procedures  . CBC with Differential/Platelet  . COMPLETE METABOLIC PANEL WITH GFR  . Urinalysis, Routine w reflex microscopic  . Rheumatoid factor  . Cyclic citrul peptide antibody, IgG  . Anti-DNA antibody, double-stranded  . ANA  . CK  . Pan-ANCA   Meds ordered this encounter  Medications  . sildenafil (REVATIO) 20 MG tablet    Sig: Take 1 tablet (20 mg total) by mouth 3 (three) times daily.    Dispense:  90  tablet    Refill:  5    Face-to-face time spent with patient was 30 minutes. 50% of time was spent in counseling and coordination of care.  Follow-Up Instructions: Return in about 1 year (around 06/27/2017) for Raynauds.   Bo Merino, MD  Note - This record has been created using Editor, commissioning.  Chart creation errors have been sought, but may not always  have been located. Such creation errors do not reflect on  the standard of medical care.

## 2016-06-27 ENCOUNTER — Ambulatory Visit (INDEPENDENT_AMBULATORY_CARE_PROVIDER_SITE_OTHER): Payer: No Typology Code available for payment source | Admitting: Rheumatology

## 2016-06-27 ENCOUNTER — Encounter: Payer: Self-pay | Admitting: Rheumatology

## 2016-06-27 VITALS — BP 110/74 | HR 82 | Resp 14 | Ht 65.0 in | Wt 175.0 lb

## 2016-06-27 DIAGNOSIS — Z9109 Other allergy status, other than to drugs and biological substances: Secondary | ICD-10-CM | POA: Diagnosis not present

## 2016-06-27 DIAGNOSIS — L719 Rosacea, unspecified: Secondary | ICD-10-CM

## 2016-06-27 DIAGNOSIS — I7300 Raynaud's syndrome without gangrene: Secondary | ICD-10-CM

## 2016-06-27 DIAGNOSIS — D6800 Von Willebrand disease, unspecified: Secondary | ICD-10-CM

## 2016-06-27 DIAGNOSIS — I10 Essential (primary) hypertension: Secondary | ICD-10-CM

## 2016-06-27 LAB — URINALYSIS, ROUTINE W REFLEX MICROSCOPIC
Bilirubin Urine: NEGATIVE
GLUCOSE, UA: NEGATIVE
HGB URINE DIPSTICK: NEGATIVE
Ketones, ur: NEGATIVE
LEUKOCYTES UA: NEGATIVE
Nitrite: NEGATIVE
PH: 6 (ref 5.0–8.0)
Protein, ur: NEGATIVE
Specific Gravity, Urine: 1.014 (ref 1.001–1.035)

## 2016-06-27 LAB — CBC WITH DIFFERENTIAL/PLATELET
BASOS PCT: 0 %
Basophils Absolute: 0 cells/uL (ref 0–200)
EOS PCT: 1 %
Eosinophils Absolute: 78 cells/uL (ref 15–500)
HCT: 42.7 % (ref 35.0–45.0)
Hemoglobin: 14.2 g/dL (ref 11.7–15.5)
LYMPHS ABS: 1872 {cells}/uL (ref 850–3900)
Lymphocytes Relative: 24 %
MCH: 29 pg (ref 27.0–33.0)
MCHC: 33.3 g/dL (ref 32.0–36.0)
MCV: 87.3 fL (ref 80.0–100.0)
MONOS PCT: 7 %
MPV: 10.9 fL (ref 7.5–12.5)
Monocytes Absolute: 546 cells/uL (ref 200–950)
NEUTROS ABS: 5304 {cells}/uL (ref 1500–7800)
Neutrophils Relative %: 68 %
Platelets: 221 10*3/uL (ref 140–400)
RBC: 4.89 MIL/uL (ref 3.80–5.10)
RDW: 13 % (ref 11.0–15.0)
WBC: 7.8 10*3/uL (ref 3.8–10.8)

## 2016-06-27 MED ORDER — SILDENAFIL CITRATE 20 MG PO TABS: 20.0000 mg | ORAL_TABLET | Freq: Three times a day (TID) | ORAL | 5 refills | Status: DC

## 2016-06-28 LAB — COMPLETE METABOLIC PANEL WITH GFR
ALT: 17 U/L (ref 6–29)
AST: 14 U/L (ref 10–30)
Albumin: 4.2 g/dL (ref 3.6–5.1)
Alkaline Phosphatase: 45 U/L (ref 33–115)
BILIRUBIN TOTAL: 0.4 mg/dL (ref 0.2–1.2)
BUN: 13 mg/dL (ref 7–25)
CO2: 22 mmol/L (ref 20–31)
CREATININE: 0.62 mg/dL (ref 0.50–1.10)
Calcium: 9.2 mg/dL (ref 8.6–10.2)
Chloride: 105 mmol/L (ref 98–110)
GFR, Est African American: >89 mL/min (ref >=60)
GLUCOSE: 91 mg/dL (ref 65–99)
Potassium: 4.1 mmol/L (ref 3.5–5.3)
SODIUM: 140 mmol/L (ref 135–146)
TOTAL PROTEIN: 6.7 g/dL (ref 6.1–8.1)

## 2016-06-28 LAB — PAN-ANCA: ANCA Screen: NEGATIVE

## 2016-06-28 LAB — CYCLIC CITRUL PEPTIDE ANTIBODY, IGG: Cyclic Citrullin Peptide Ab: <16 Units

## 2016-06-28 LAB — RHEUMATOID FACTOR: Rhuematoid fact SerPl-aCnc: <14 IU/mL (ref <14)

## 2016-06-28 LAB — ANA: Anti Nuclear Antibody(ANA): NEGATIVE

## 2016-06-28 LAB — ANTI-DNA ANTIBODY, DOUBLE-STRANDED: DS DNA AB: 2 [IU]/mL

## 2016-06-28 LAB — CK: Total CK: 49 U/L (ref 7–177)

## 2016-07-01 NOTE — Progress Notes (Signed)
All labs normal

## 2017-05-28 ENCOUNTER — Other Ambulatory Visit: Payer: Self-pay | Admitting: Orthopedic Surgery

## 2017-06-20 ENCOUNTER — Encounter (HOSPITAL_BASED_OUTPATIENT_CLINIC_OR_DEPARTMENT_OTHER): Payer: Self-pay | Admitting: *Deleted

## 2017-06-20 ENCOUNTER — Other Ambulatory Visit: Payer: Self-pay

## 2017-06-26 ENCOUNTER — Ambulatory Visit (HOSPITAL_BASED_OUTPATIENT_CLINIC_OR_DEPARTMENT_OTHER): Payer: Commercial Managed Care - PPO | Admitting: Anesthesiology

## 2017-06-26 ENCOUNTER — Ambulatory Visit: Payer: No Typology Code available for payment source | Admitting: Rheumatology

## 2017-06-26 ENCOUNTER — Other Ambulatory Visit: Payer: Self-pay

## 2017-06-26 ENCOUNTER — Encounter (HOSPITAL_BASED_OUTPATIENT_CLINIC_OR_DEPARTMENT_OTHER)
Admission: RE | Disposition: A | Discharge status: Home / Self Care | Payer: Self-pay | Source: Ambulatory Visit | Attending: Orthopedic Surgery

## 2017-06-26 ENCOUNTER — Ambulatory Visit (HOSPITAL_BASED_OUTPATIENT_CLINIC_OR_DEPARTMENT_OTHER)
Admission: RE | Admit: 2017-06-26 | Discharge: 2017-06-26 | Disposition: A | Discharge status: Home / Self Care | Payer: Commercial Managed Care - PPO | Source: Ambulatory Visit | Attending: Orthopedic Surgery | Admitting: Orthopedic Surgery

## 2017-06-26 ENCOUNTER — Encounter (HOSPITAL_BASED_OUTPATIENT_CLINIC_OR_DEPARTMENT_OTHER): Payer: Self-pay | Admitting: Anesthesiology

## 2017-06-26 DIAGNOSIS — Z85820 Personal history of malignant melanoma of skin: Secondary | ICD-10-CM | POA: Diagnosis not present

## 2017-06-26 DIAGNOSIS — Z79899 Other long term (current) drug therapy: Secondary | ICD-10-CM | POA: Diagnosis not present

## 2017-06-26 DIAGNOSIS — M1990 Unspecified osteoarthritis, unspecified site: Secondary | ICD-10-CM | POA: Diagnosis not present

## 2017-06-26 DIAGNOSIS — Z91040 Latex allergy status: Secondary | ICD-10-CM | POA: Diagnosis not present

## 2017-06-26 DIAGNOSIS — M722 Plantar fascial fibromatosis: Secondary | ICD-10-CM | POA: Insufficient documentation

## 2017-06-26 DIAGNOSIS — K219 Gastro-esophageal reflux disease without esophagitis: Secondary | ICD-10-CM | POA: Insufficient documentation

## 2017-06-26 DIAGNOSIS — D680 Von Willebrand's disease: Secondary | ICD-10-CM | POA: Diagnosis not present

## 2017-06-26 DIAGNOSIS — J45909 Unspecified asthma, uncomplicated: Secondary | ICD-10-CM | POA: Insufficient documentation

## 2017-06-26 DIAGNOSIS — I10 Essential (primary) hypertension: Secondary | ICD-10-CM | POA: Diagnosis not present

## 2017-06-26 HISTORY — DX: Nausea with vomiting, unspecified: Z98.890

## 2017-06-26 HISTORY — DX: Nausea with vomiting, unspecified: R11.2

## 2017-06-26 HISTORY — DX: Gastro-esophageal reflux disease without esophagitis: K21.9

## 2017-06-26 HISTORY — DX: Von Willebrand's disease: D68.0

## 2017-06-26 HISTORY — DX: Malignant (primary) neoplasm, unspecified: C80.1

## 2017-06-26 HISTORY — DX: Von Willebrand disease, unspecified: D68.00

## 2017-06-26 HISTORY — PX: PLANTAR FASCIA RELEASE: SHX2239

## 2017-06-26 SURGERY — RELEASE, FASCIA, PLANTAR
Anesthesia: General | Site: Foot | Laterality: Left

## 2017-06-26 MED ORDER — BUPIVACAINE-EPINEPHRINE 0.5% -1:200000 IJ SOLN
INTRAMUSCULAR | Status: DC | PRN
  Administered 2017-06-26: 10 mL

## 2017-06-26 MED ORDER — CEFAZOLIN SODIUM-DEXTROSE 2-4 GM/100ML-% IV SOLN
INTRAVENOUS | Status: AC
  Filled 2017-06-26: qty 100

## 2017-06-26 MED ORDER — LACTATED RINGERS IV SOLN
INTRAVENOUS | Status: DC
  Administered 2017-06-26: 12:00:00 via INTRAVENOUS

## 2017-06-26 MED ORDER — MIDAZOLAM HCL 2 MG/2ML IJ SOLN
1.0000 mg | INTRAMUSCULAR | Status: DC | PRN
  Administered 2017-06-26: 2 mg via INTRAVENOUS

## 2017-06-26 MED ORDER — DEXAMETHASONE SODIUM PHOSPHATE 4 MG/ML IJ SOLN
INTRAMUSCULAR | Status: DC | PRN
  Administered 2017-06-26: 10 mg via INTRAVENOUS

## 2017-06-26 MED ORDER — FENTANYL CITRATE (PF) 100 MCG/2ML IJ SOLN
INTRAMUSCULAR | Status: AC
  Filled 2017-06-26: qty 2

## 2017-06-26 MED ORDER — LIDOCAINE HCL (CARDIAC) 20 MG/ML IV SOLN
INTRAVENOUS | Status: DC | PRN
  Administered 2017-06-26: 40 mg via INTRAVENOUS

## 2017-06-26 MED ORDER — CHLORHEXIDINE GLUCONATE 4 % EX LIQD: 60.0000 mL | Freq: Once | CUTANEOUS | Status: DC

## 2017-06-26 MED ORDER — TRAMADOL HCL 50 MG PO TABS: 50.0000 mg | ORAL_TABLET | Freq: Four times a day (QID) | ORAL | 0 refills | Status: AC | PRN

## 2017-06-26 MED ORDER — ONDANSETRON HCL 4 MG/2ML IJ SOLN
INTRAMUSCULAR | Status: DC | PRN
  Administered 2017-06-26: 4 mg via INTRAVENOUS

## 2017-06-26 MED ORDER — PROPOFOL 10 MG/ML IV BOLUS
INTRAVENOUS | Status: DC | PRN
  Administered 2017-06-26: 150 mg via INTRAVENOUS

## 2017-06-26 MED ORDER — FENTANYL CITRATE (PF) 100 MCG/2ML IJ SOLN
50.0000 ug | INTRAMUSCULAR | Status: DC | PRN
  Administered 2017-06-26: 100 ug via INTRAVENOUS
  Administered 2017-06-26: 50 ug via INTRAVENOUS

## 2017-06-26 MED ORDER — MIDAZOLAM HCL 2 MG/2ML IJ SOLN
INTRAMUSCULAR | Status: AC
  Filled 2017-06-26: qty 2

## 2017-06-26 MED ORDER — SODIUM CHLORIDE 0.9 % IV SOLN: INTRAVENOUS | Status: DC

## 2017-06-26 MED ORDER — PROPOFOL 10 MG/ML IV BOLUS
INTRAVENOUS | Status: AC
  Filled 2017-06-26: qty 20

## 2017-06-26 MED ORDER — CEFAZOLIN SODIUM-DEXTROSE 2-4 GM/100ML-% IV SOLN
2.0000 g | INTRAVENOUS | Status: AC
  Administered 2017-06-26: 2 g via INTRAVENOUS

## 2017-06-26 MED ORDER — SCOPOLAMINE 1 MG/3DAYS TD PT72
MEDICATED_PATCH | TRANSDERMAL | Status: AC
  Filled 2017-06-26: qty 1

## 2017-06-26 MED ORDER — 0.9 % SODIUM CHLORIDE (POUR BTL) OPTIME
TOPICAL | Status: DC | PRN
  Administered 2017-06-26: 200 mL

## 2017-06-26 MED ORDER — SCOPOLAMINE 1 MG/3DAYS TD PT72
1.0000 | MEDICATED_PATCH | Freq: Once | TRANSDERMAL | Status: DC | PRN
  Administered 2017-06-26: 1.5 mg via TRANSDERMAL

## 2017-06-26 SURGICAL SUPPLY — 60 items
BANDAGE ACE 4X5 VEL STRL LF (GAUZE/BANDAGES/DRESSINGS) ×3 IMPLANT
BANDAGE ACE 6X5 VEL STRL LF (GAUZE/BANDAGES/DRESSINGS) IMPLANT
BANDAGE ESMARK 6X9 LF (GAUZE/BANDAGES/DRESSINGS) IMPLANT
BLADE SURG 15 STRL LF DISP TIS (BLADE) ×3 IMPLANT
BLADE SURG 15 STRL SS (BLADE) ×6
BNDG CMPR 9X6 STRL LF SNTH (GAUZE/BANDAGES/DRESSINGS)
BNDG COHESIVE 4X5 TAN STRL (GAUZE/BANDAGES/DRESSINGS) ×2 IMPLANT
BNDG COHESIVE 6X5 TAN STRL LF (GAUZE/BANDAGES/DRESSINGS) IMPLANT
BNDG ESMARK 6X9 LF (GAUZE/BANDAGES/DRESSINGS)
BOOT STEPPER DURA MED (SOFTGOODS) ×2 IMPLANT
CHLORAPREP W/TINT 26ML (MISCELLANEOUS) ×3 IMPLANT
COVER BACK TABLE 60X90IN (DRAPES) ×3 IMPLANT
CUFF TOURNIQUET SINGLE 34IN LL (TOURNIQUET CUFF) ×2 IMPLANT
DRAPE EXTREMITY T 121X128X90 (DRAPE) ×3 IMPLANT
DRAPE U-SHAPE 47X51 STRL (DRAPES) ×3 IMPLANT
DRSG MEPITEL 4X7.2 (GAUZE/BANDAGES/DRESSINGS) ×3 IMPLANT
DRSG PAD ABDOMINAL 8X10 ST (GAUZE/BANDAGES/DRESSINGS) ×3 IMPLANT
ELECT REM PT RETURN 9FT ADLT (ELECTROSURGICAL) ×3
ELECTRODE REM PT RTRN 9FT ADLT (ELECTROSURGICAL) ×2 IMPLANT
GAUZE SPONGE 4X4 12PLY STRL (GAUZE/BANDAGES/DRESSINGS) ×3 IMPLANT
GLOVE BIO SURGEON STRL SZ 6.5 (GLOVE) ×2 IMPLANT
GLOVE BIO SURGEON STRL SZ8 (GLOVE) ×2 IMPLANT
GLOVE BIOGEL PI IND STRL 7.0 (GLOVE) ×1 IMPLANT
GLOVE BIOGEL PI IND STRL 8 (GLOVE) ×3 IMPLANT
GLOVE BIOGEL PI INDICATOR 7.0 (GLOVE) ×1
GLOVE BIOGEL PI INDICATOR 8 (GLOVE) ×1
GLOVE SURG SS PI 8.0 STRL IVOR (GLOVE) ×3 IMPLANT
GOWN STRL REUS W/ TWL LRG LVL3 (GOWN DISPOSABLE) ×2 IMPLANT
GOWN STRL REUS W/ TWL XL LVL3 (GOWN DISPOSABLE) ×3 IMPLANT
GOWN STRL REUS W/TWL LRG LVL3 (GOWN DISPOSABLE) ×3
GOWN STRL REUS W/TWL XL LVL3 (GOWN DISPOSABLE) ×3
NDL HYPO 25X1 1.5 SAFETY (NEEDLE) IMPLANT
NEEDLE HYPO 22GX1.5 SAFETY (NEEDLE) IMPLANT
NEEDLE HYPO 25X1 1.5 SAFETY (NEEDLE) ×3 IMPLANT
NS IRRIG 1000ML POUR BTL (IV SOLUTION) ×3 IMPLANT
PACK BASIN DAY SURGERY FS (CUSTOM PROCEDURE TRAY) ×3 IMPLANT
PAD CAST 4YDX4 CTTN HI CHSV (CAST SUPPLIES) ×2 IMPLANT
PADDING CAST ABS 4INX4YD NS (CAST SUPPLIES)
PADDING CAST ABS COTTON 4X4 ST (CAST SUPPLIES) IMPLANT
PADDING CAST COTTON 4X4 STRL (CAST SUPPLIES) ×3
PADDING CAST COTTON 6X4 STRL (CAST SUPPLIES) IMPLANT
PENCIL BUTTON HOLSTER BLD 10FT (ELECTRODE) IMPLANT
SANITIZER HAND PURELL 535ML FO (MISCELLANEOUS) ×3 IMPLANT
SHEET MEDIUM DRAPE 40X70 STRL (DRAPES) ×3 IMPLANT
SPLINT FAST PLASTER 5X30 (CAST SUPPLIES)
SPLINT PLASTER CAST FAST 5X30 (CAST SUPPLIES) IMPLANT
SPONGE LAP 18X18 X RAY DECT (DISPOSABLE) ×3 IMPLANT
STOCKINETTE 6  STRL (DRAPES) ×1
STOCKINETTE 6 STRL (DRAPES) ×2 IMPLANT
SUCTION FRAZIER HANDLE 10FR (MISCELLANEOUS) ×1
SUCTION TUBE FRAZIER 10FR DISP (MISCELLANEOUS) ×1 IMPLANT
SUT ETHILON 3 0 PS 1 (SUTURE) ×3 IMPLANT
SUT MNCRL AB 3-0 PS2 18 (SUTURE) ×3 IMPLANT
SUT VIC AB 0 SH 27 (SUTURE) IMPLANT
SYR BULB 3OZ (MISCELLANEOUS) ×3 IMPLANT
SYR CONTROL 10ML LL (SYRINGE) ×2 IMPLANT
TOWEL OR 17X24 6PK STRL BLUE (TOWEL DISPOSABLE) ×3 IMPLANT
TUBE CONNECTING 20X1/4 (TUBING) ×2 IMPLANT
UNDERPAD 30X30 (UNDERPADS AND DIAPERS) ×3 IMPLANT
YANKAUER SUCT BULB TIP NO VENT (SUCTIONS) IMPLANT

## 2017-06-26 NOTE — Anesthesia Preprocedure Evaluation (Addendum)
Anesthesia Evaluation  Patient identified by MRN, date of birth, ID band Patient awake    Reviewed: Allergy & Precautions, NPO status , Patient's Chart, lab work & pertinent test results  History of Anesthesia Complications (+) PONV and history of anesthetic complications  Airway Mallampati: II  TM Distance: >3 FB Neck ROM: Full    Dental no notable dental hx.    Pulmonary asthma ,    Pulmonary exam normal breath sounds clear to auscultation       Cardiovascular hypertension, + Peripheral Vascular Disease  Normal cardiovascular exam Rhythm:Regular Rate:Normal     Neuro/Psych negative neurological ROS  negative psych ROS   GI/Hepatic Neg liver ROS, GERD  ,  Endo/Other  negative endocrine ROS  Renal/GU negative Renal ROS     Musculoskeletal  (+) Arthritis ,   Abdominal   Peds  Hematology  (+) Blood dyscrasia, anemia ,   Anesthesia Other Findings   Reproductive/Obstetrics negative OB ROS                             Anesthesia Physical Anesthesia Plan  ASA: II  Anesthesia Plan: General   Post-op Pain Management:    Induction: Intravenous  PONV Risk Score and Plan: 4 or greater and Ondansetron, Dexamethasone, Treatment may vary due to age or medical condition, Midazolam and Scopolamine patch - Pre-op  Airway Management Planned: LMA and Oral ETT  Additional Equipment:   Intra-op Plan:   Post-operative Plan: Extubation in OR  Informed Consent: I have reviewed the patients History and Physical, chart, labs and discussed the procedure including the risks, benefits and alternatives for the proposed anesthesia with the patient or authorized representative who has indicated his/her understanding and acceptance.   Dental advisory given  Plan Discussed with: CRNA  Anesthesia Plan Comments:        Anesthesia Quick Evaluation

## 2017-06-26 NOTE — Anesthesia Procedure Notes (Signed)
Procedure Name: LMA Insertion Performed by: Doralyn Kirkes W, CRNA Pre-anesthesia Checklist: Patient identified, Emergency Drugs available, Suction available and Patient being monitored Patient Re-evaluated:Patient Re-evaluated prior to induction Oxygen Delivery Method: Circle system utilized Preoxygenation: Pre-oxygenation with 100% oxygen Induction Type: IV induction Ventilation: Mask ventilation without difficulty LMA: LMA inserted LMA Size: 4.0 Number of attempts: 1 Placement Confirmation: positive ETCO2 Tube secured with: Tape Dental Injury: Teeth and Oropharynx as per pre-operative assessment        

## 2017-06-26 NOTE — Op Note (Signed)
06/26/2017  2:14 PM  PATIENT:  Ann Ramos  47 y.o. female  PRE-OPERATIVE DIAGNOSIS:  Left chronic plantar fasciitis  POST-OPERATIVE DIAGNOSIS:  Same  Procedure(s):  Left plantar fascia release  SURGEON:  Wylene Simmer, MD  ASSISTANT:  none  ANESTHESIA:   General  EBL:  minimal   TOURNIQUET:   Total Tourniquet Time Documented: Thigh (Left) - 5 minutes Total: Thigh (Left) - 5 minutes  COMPLICATIONS:  None apparent  DISPOSITION:  Extubated, awake and stable to recovery.  INDICATION FOR PROCEDURE: The patient is a 47 year old female with past medical history significant for von Willebrand disease.  She has a long history of left plantar heel pain consistent with plantar fasciitis.  She has failed nonoperative treatment to date including activity modification, oral anti-inflammatories, shoewear modification, physical therapy, steroid injection and casting.  She presents today for plantar fascia release.  The risks and benefits of the alternative treatment options have been discussed in detail.  The patient wishes to proceed with surgery and specifically understands risks of bleeding, infection, nerve damage, blood clots, need for additional surgery, amputation and death.  PROCEDURE IN DETAIL:  After pre operative consent was obtained, and the correct operative site was identified, the patient was brought to the operating room and placed supine on the OR table.  Anesthesia was administered.  Pre-operative antibiotics were administered.  A surgical timeout was taken.  The left lower extremity was prepped and draped in standard sterile fashion with a tourniquet around the thigh.  The extremity was exsanguinated and the tourniquet was inflated to 250 mmHg.  A longitudinal incision was made just adjacent to the medial border of the plantar fascia in the mid arch.  Dissection was carried down through the subcutaneous tissue to the medial cord of the plantar fascia.  Blunt dissection was then  carried plantar to the fascia exposing it appropriately.  Care was taken to protect the deep structures.  A scalpel was used to divide the plantar fascia from the medial border to approximately three fourths of its width.  The wound was irrigated copiously.  The tourniquet was released and hemostasis was achieved.  Horizontal mattress sutures of 3-0 nylon were used skin incision.  Half percent Marcaine with epinephrine was infiltrated into the skin and subtendinous tissue surrounding the incision.  Sterile dressings were applied followed by a compression wrap.  The patient was awakened from anesthesia and transported to the recovery room in stable condition.  FOLLOW UP PLAN: Weightbearing as tolerated in a flat postop shoe.  Follow-up with me in the office in 2 weeks for suture removal and to initiate physical therapy.

## 2017-06-26 NOTE — Discharge Instructions (Signed)
°  Post Anesthesia Home Care Instructions  Activity: Get plenty of rest for the remainder of the day. A responsible individual must stay with you for 24 hours following the procedure.  For the next 24 hours, DO NOT: -Drive a car -Paediatric nurse -Drink alcoholic beverages -Take any medication unless instructed by your physician -Make any legal decisions or sign important papers.  Meals: Start with liquid foods such as gelatin or soup. Progress to regular foods as tolerated. Avoid greasy, spicy, heavy foods. If nausea and/or vomiting occur, drink only clear liquids until the nausea and/or vomiting subsides. Call your physician if vomiting continues.  Special Instructions/Symptoms: Your throat may feel dry or sore from the anesthesia or the breathing tube placed in your throat during surgery. If this causes discomfort, gargle with warm salt water. The discomfort should disappear within 24 hours.  If you had a scopolamine patch placed behind your ear for the management of post- operative nausea and/or vomiting:  1. The medication in the patch is effective for 72 hours, after which it should be removed.  Wrap patch in a tissue and discard in the trash. Wash hands thoroughly with soap and water. 2. You may remove the patch earlier than 72 hours if you experience unpleasant side effects which may include dry mouth, dizziness or visual disturbances. 3. Avoid touching the patch. Wash your hands with soap and water after contact with the patch.     Wylene Simmer, MD [facility]  Please read the following information regarding your care after surgery.  Medications  You only need a prescription for the narcotic pain medicine (ex. oxycodone, Percocet, Norco).  All of the other medicines listed below are available over the counter.  X acetominophen (Tylenol) 650 mg every 4-6 hours as you need for minor to moderate pain X tramadol as prescribed for severe pain  Weight Bearing X Bear  weight when you are able on your operated leg or foot in the cam boot. ? Bear weight only on your operated foot in the post-op shoe. ? Do not bear any weight on the operated leg or foot.  Cast / Splint / Dressing X Keep your splint, cast or dressing clean and dry.  Dont put anything (coat hanger, pencil, etc) down inside of it.  If it gets damp, use a hair dryer on the cool setting to dry it.  If it gets soaked, call the office to schedule an appointment for a cast change. ? Remove your dressing 3 days after surgery and cover the incisions with dry dressings.    After your dressing, cast or splint is removed; you may shower, but do not soak or scrub the wound.  Allow the water to run over it, and then gently pat it dry.  Swelling It is normal for you to have swelling where you had surgery.  To reduce swelling and pain, keep your toes above your nose for at least 3 days after surgery.  It may be necessary to keep your foot or leg elevated for several weeks.  If it hurts, it should be elevated.  Follow Up Call my office at [phone] when you are discharged from the hospital or surgery center to schedule an appointment to be seen two weeks after surgery.  Call my office at [phone] if you develop a fever >101.5 F, nausea, vomiting, bleeding from the surgical site or severe pain.

## 2017-06-26 NOTE — H&P (Signed)
Ann Ramos is an 47 y.o. female.   Chief Complaint: Left heel pain HPI: The patient is a 47 year old female with past medical history significant for von Willebrand disease.  She has a long history of left heel pain from chronic plantar fasciitis.  She has failed nonoperative treatment to date and presents for left plantar fascia release.  Past Medical History:  Diagnosis Date  . Allergy   . Anemia   . Arthritis   . Asthma   . Cancer [facility])    stage one melanoma in June 2018  . GERD (gastroesophageal reflux disease)    takes protonix  . Hypertension   . PONV (postoperative nausea and vomiting)   . Von Willebrand disease ([place])    being followed for borderline status     Past Surgical History:  Procedure Laterality Date  . CHOLECYSTECTOMY    . Dilation and Cutterege     polys  . MELANOMA EXCISION     left arm  . TUBAL LIGATION    . uterine ablation      Family History  Problem Relation Age of Onset  . Cancer Mother        Breast   . Hyperlipidemia Mother   . Diabetes Father   . Hyperlipidemia Father   . Hypertension Father   . Varicose Veins Father    Social History:  reports that  has never smoked. she has never used smokeless tobacco. She reports that she drinks alcohol. She reports that she does not use drugs.  Allergies:  Allergies  Allergen Reactions  . Adhesive [Tape] Dermatitis  . Latex Dermatitis    Medications Prior to Admission  Medication Sig Dispense Refill  . cycloSPORINE (RESTASIS) 0.05 % ophthalmic emulsion 1 drop 2 (two) times daily.    Marland Kitchen losartan (COZAAR) 50 MG tablet Take 50 mg by mouth daily.    . meloxicam (MOBIC) 15 MG tablet Take 15 mg by mouth daily.    . mometasone (NASONEX) 50 MCG/ACT nasal spray Place 2 sprays into the nose daily. Both nostrils.    . Norethindrone Acetate-Ethinyl Estrad-FE (BLISOVI 24 FE) 1-20 MG-MCG(24) tablet Take 1 tablet by mouth daily.    . pantoprazole (PROTONIX) 40 MG tablet Take 40 mg by mouth daily.    .  fexofenadine-pseudoephedrine (ALLEGRA-D 24) 180-240 MG per 24 hr tablet Take 1 tablet by mouth as needed.    . sildenafil (REVATIO) 20 MG tablet Take 1 tablet (20 mg total) by mouth 3 (three) times daily. 90 tablet 5  . tiZANidine (ZANAFLEX) 4 MG capsule TAKE 1 CAPSULE EVERY DAY IN THE EVENING  6    No results found for this or any previous visit (from the past 48 hour(s)). No results found.  ROS no recent fever, chills, nausea, vomiting or changes in her appetite.  No recent bleeding episodes.  Blood pressure 129/90, pulse 81, temperature 99 F (37.2 C), temperature source Oral, resp. rate 16, height 5\' 6"  (1.676 m), weight 79.4 kg (175 lb), last menstrual period 03/31/2017, SpO2 100 %. Physical Exam  Well-nourished well-developed woman in no apparent distress.  Alert and oriented x4.  Mood and affect are normal.  Extraocular motions are intact.  Respirations are unlabored.  Her gait is antalgic to the left.  Left heel tender to palpation at the origin of the plantar fascia.  Skin is healthy and intact.  No lymphadenopathy.  Sensibility to light touch is intact dorsally and plantarly.  5 out of 5 strength in plantar flexion  and dorsi flexion of the ankle and toes.  Ankle dorsiflexion today is approximately 20 degrees with her knee extended.  Assessment/Plan Left heel pain from chronic plantar fasciitis -to operating room for left plantar fascial release.  The risks and benefits of the alternative treatment options have been discussed in detail.  The patient wishes to proceed with surgery and specifically understands risks of bleeding, infection, nerve damage, blood clots, need for additional surgery, amputation and death. Dr. Lissa Hoard and I discussed her history of von Willebrand disease and its relation to bleeding risk for her surgery today.  Due to her increased risk of bleeding we have decided to avoid the gastrocnemius recession since it requires deeper dissection.  She understands the plan  and agrees.  Wylene Simmer, MD 06/26/2017, 1:26 PM

## 2017-06-26 NOTE — Transfer of Care (Signed)
Immediate Anesthesia Transfer of Care Note  Patient: Ann Ramos  Procedure(s) Performed: PLANTAR FASCIA RELEASE (Left Foot)  Patient Location: PACU  Anesthesia Type:General  Level of Consciousness: awake and sedated  Airway & Oxygen Therapy: Patient Spontanous Breathing and Patient connected to face mask oxygen  Post-op Assessment: Report given to RN and Post -op Vital signs reviewed and stable  Post vital signs: Reviewed and stable  Last Vitals:  Vitals:   06/26/17 1203  BP: 129/90  Pulse: 81  Resp: 16  Temp: 37.2 C  SpO2: 100%    Last Pain:  Vitals:   06/26/17 1203  TempSrc: Oral  PainSc: 6       Patients Stated Pain Goal: 3 (74/12/87 [id number])  Complications: No apparent anesthesia complications

## 2017-06-27 ENCOUNTER — Encounter (HOSPITAL_BASED_OUTPATIENT_CLINIC_OR_DEPARTMENT_OTHER): Payer: Self-pay | Admitting: Anesthesiology

## 2017-06-27 NOTE — Anesthesia Postprocedure Evaluation (Signed)
Anesthesia Post Note  Patient: Ann Ramos  Procedure(s) Performed: PLANTAR FASCIA RELEASE (Left Foot)     Patient location during evaluation: PACU Anesthesia Type: General Level of consciousness: sedated and patient cooperative Pain management: pain level controlled Vital Signs Assessment: post-procedure vital signs reviewed and stable Respiratory status: spontaneous breathing Cardiovascular status: stable Anesthetic complications: no    Last Vitals:  Vitals:   06/26/17 1445 06/26/17 1514  BP: 114/76 133/81  Pulse: 78 79  Resp: 11 16  Temp:  36.9 C  SpO2: 100% 100%    Last Pain:  Vitals:   06/26/17 1514  TempSrc: Oral  PainSc:                  Nolon Nations

## 2017-07-25 ENCOUNTER — Other Ambulatory Visit: Payer: Self-pay | Admitting: Obstetrics and Gynecology

## 2017-07-25 DIAGNOSIS — Z139 Encounter for screening, unspecified: Secondary | ICD-10-CM

## 2017-07-26 ENCOUNTER — Other Ambulatory Visit: Payer: Self-pay | Admitting: Obstetrics and Gynecology

## 2017-07-26 DIAGNOSIS — Z803 Family history of malignant neoplasm of breast: Secondary | ICD-10-CM

## 2017-08-08 ENCOUNTER — Other Ambulatory Visit: Payer: Commercial Managed Care - PPO

## 2017-08-20 ENCOUNTER — Ambulatory Visit
Admission: RE | Admit: 2017-08-20 | Discharge: 2017-08-20 | Disposition: A | Discharge status: Home / Self Care | Payer: Commercial Managed Care - PPO | Source: Ambulatory Visit | Attending: Obstetrics and Gynecology | Admitting: Obstetrics and Gynecology

## 2017-08-20 DIAGNOSIS — Z803 Family history of malignant neoplasm of breast: Secondary | ICD-10-CM

## 2017-08-20 MED ORDER — GADOBENATE DIMEGLUMINE 529 MG/ML IV SOLN
16.0000 mL | Freq: Once | INTRAVENOUS | Status: AC | PRN
  Administered 2017-08-20: 16 mL via INTRAVENOUS

## 2019-06-29 ENCOUNTER — Other Ambulatory Visit: Payer: Self-pay | Admitting: Urology

## 2019-08-23 NOTE — Progress Notes (Addendum)
CAN YOU PLEASE PLACE SOME ORDERS FOR THE [facility] SURGERY. Pt. PST INTERVIEW IS ON 08/24/19.[place]

## 2019-08-23 NOTE — Patient Instructions (Addendum)
DUE TO COVID-19 ONLY ONE VISITOR IS ALLOWED IN WAITING ROOM (VISITOR WILL HAVE A TEMPERATURE CHECK ON ARRIVAL AND MUST WEAR A FACE MASK THE ENTIRE TIME.)  ONCE YOU ARE ADMITTED TO YOUR PRIVATE ROOM, THE SAME ONE VISITOR IS ALLOWED TO VISIT DURING VISITING HOURS ONLY.  Your COVID swab testing is scheduled for: 08/26/19 You must self quarantine after your testing per handout given to you at the testing site.  ([place] up testing enter pre-surgical testing line)     Report to [facility] AT : 5:30 A. M.   Call this number if you have problems the morning of surgery:[phone].   OUR ADDRESS IS [address].  WE ARE LOCATED IN THE [place]                                   [facility].                                     REMEMBER:   DO NOT EAT FOOD OR DRINK LIQUIDS AFTER MIDNIGHT .    BRUSH YOUR TEETH THE MORNING OF SURGERY.  TAKE THESE MEDICATIONS MORNING OF SURGERY WITH A SIP OF WATER:  CETIRIZINE,PANTOPRAZOLE.USE INHALERS AS NEEDED..  DO NOT WEAR JEWERLY, MAKE UP, OR NAIL POLISH.  DO NOT WEAR LOTIONS, POWDERS, PERFUMES/COLOGNE OR DEODORANT.  DO NOT SHAVE FOR 24 HOURS PRIOR TO DAY OF SURGERY.  MEN MAY SHAVE FACE AND NECK.  CONTACTS, GLASSES, OR DENTURES MAY NOT BE WORN TO SURGERY.                                    [facility] IS NOT RESPONSIBLE  FOR ANY BELONGINGS.          BRING ALL PRESCRIPTION MEDICATIONS WITH YOU THE DAY OF SURGERY IN ORIGINAL CONTAINERS                                                               YOU MAY BRING A SMALL OVERNIGHT BAG  .[facility] - Preparing for Surgery Before surgery, you can play an important role.  Because skin is not sterile, your skin needs to be as free of germs as possible.  You can reduce the number of germs on your skin by washing with CHG (chlorahexidine gluconate) soap before surgery.  CHG is an antiseptic cleaner which kills germs and bonds with  the skin to continue killing germs even after washing. Please DO NOT use if you have an allergy to CHG or antibacterial soaps.  If your skin becomes reddened/irritated stop using the CHG and inform your nurse when you arrive at Short Stay. Do not shave (including legs and underarms) for at least 48 hours prior to the first CHG shower.  You may shave your face/neck. Please follow these instructions carefully:  1.  Shower with CHG Soap the night before surgery and the  morning of Surgery.  2.  If you choose to wash your hair, wash your hair first as  usual with your  normal  shampoo.  3.  After you shampoo, rinse your hair and body thoroughly to remove the  shampoo.                           4.  Use CHG as you would any other liquid soap.  You can apply chg directly  to the skin and wash                       Gently with a scrungie or clean washcloth.  5.  Apply the CHG Soap to your body ONLY FROM THE NECK DOWN.   Do not use on face/ open                           Wound or open sores. Avoid contact with eyes, ears mouth and genitals (private parts).                       Wash face,  Genitals (private parts) with your normal soap.             6.  Wash thoroughly, paying special attention to the area where your surgery  will be performed.  7.  Thoroughly rinse your body with warm water from the neck down.  8.  DO NOT shower/wash with your normal soap after using and rinsing off  the CHG Soap.                9.  Pat yourself dry with a clean towel.            10.  Wear clean pajamas.            11.  Place clean sheets on your bed the night of your first shower and do not  sleep with pets. Day of Surgery : Do not apply any lotions/deodorants the morning of surgery.  Please wear clean clothes to the hospital/surgery center.  FAILURE TO FOLLOW THESE INSTRUCTIONS MAY RESULT IN THE CANCELLATION OF YOUR SURGERY PATIENT SIGNATURE_________________________________  NURSE  SIGNATURE__________________________________  ________________________________________________________________________

## 2019-08-24 ENCOUNTER — Encounter (HOSPITAL_COMMUNITY): Payer: Self-pay

## 2019-08-24 ENCOUNTER — Other Ambulatory Visit: Payer: Self-pay

## 2019-08-24 ENCOUNTER — Encounter (HOSPITAL_COMMUNITY)
Admission: RE | Admit: 2019-08-24 | Discharge: 2019-08-24 | Disposition: A | Discharge status: Home / Self Care | Payer: Commercial Managed Care - PPO | Source: Ambulatory Visit | Attending: Obstetrics and Gynecology | Admitting: Obstetrics and Gynecology

## 2019-08-24 ENCOUNTER — Observation Stay (HOSPITAL_BASED_OUTPATIENT_CLINIC_OR_DEPARTMENT_OTHER): Payer: Commercial Managed Care - PPO | Admitting: Physician Assistant

## 2019-08-24 ENCOUNTER — Observation Stay (HOSPITAL_BASED_OUTPATIENT_CLINIC_OR_DEPARTMENT_OTHER): Payer: Commercial Managed Care - PPO | Admitting: Anesthesiology

## 2019-08-24 DIAGNOSIS — Z01812 Encounter for preprocedural laboratory examination: Secondary | ICD-10-CM | POA: Insufficient documentation

## 2019-08-24 HISTORY — DX: Pneumonia, unspecified organism: J18.9

## 2019-08-24 LAB — BASIC METABOLIC PANEL
Anion gap: 9 (ref 5–15)
BUN: 11 mg/dL (ref 6–20)
CO2: 23 mmol/L (ref 22–32)
Calcium: 9.3 mg/dL (ref 8.9–10.3)
Chloride: 109 mmol/L (ref 98–111)
Creatinine, Ser: 0.93 mg/dL (ref 0.44–1.00)
GFR calc Af Amer: >60 mL/min (ref >60)
GFR calc non Af Amer: >60 mL/min (ref >60)
Glucose, Bld: 115 mg/dL — ABNORMAL HIGH (ref 70–99)
Potassium: 3.4 mmol/L — ABNORMAL LOW (ref 3.5–5.1)
Sodium: 141 mmol/L (ref 135–145)

## 2019-08-24 LAB — CBC
HCT: 42.9 % (ref 36.0–46.0)
Hemoglobin: 14 g/dL (ref 12.0–15.0)
MCH: 28.7 pg (ref 26.0–34.0)
MCHC: 32.6 g/dL (ref 30.0–36.0)
MCV: 88.1 fL (ref 80.0–100.0)
Platelets: 234 10*3/uL (ref 150–400)
RBC: 4.87 MIL/uL (ref 3.87–5.11)
RDW: 13.2 % (ref 11.5–15.5)
WBC: 9.2 10*3/uL (ref 4.0–10.5)
nRBC: 0 % (ref 0.0–0.2)

## 2019-08-24 NOTE — Progress Notes (Signed)
PCP - DR. ROBBINS R. Cardiologist -   Chest x-ray -  EKG -  Stress Test -  ECHO -  Cardiac Cath -   Sleep Study -  CPAP -   Fasting Blood Sugar -  Checks Blood Sugar _____ times a day  Blood Thinner Instructions: Aspirin Instructions: Last Dose:  Anesthesia review:   Patient denies shortness of breath, fever, cough and chest pain at PAT appointment   Patient verbalized understanding of instructions that were given to them at the PAT appointment. Patient was also instructed that they will need to review over the PAT instructions again at home before surgery.

## 2019-08-25 LAB — ABO/RH: ABO/RH(D): A NEG

## 2019-08-25 NOTE — Progress Notes (Signed)
Received the report from [facility] urgent care about COVID positive results on 07/14/19.Copy is on the chart.

## 2019-08-27 NOTE — H&P (Signed)
cc: SUI, urinary urgency   08/24/19: Patient with below noted history. She presents today for preoperative assessment. She is scheduled for mid urethral sling on 03/15, which will be performed in conjunction with her hysterectomy. She denies any changes in lower urinary tract symptoms since being seen last. She continues to have ongoing problems with stress urinary incontinence. She has undergone pelvic floor physical therapy in the interim, which did help somewhat. She denies dysuria or gross hematuria. No changes to her overall health.   06/03/19: 49 year old woman with stress urinary continence and urinary urgency here for cystoscopy and pelvic exam.   05/07/19: 49 year old woman with 5+ year history of stress urinary incontinence that has been worsening recently. She has had 2 vaginal deliveries without complication. She has had endometrial ablation in the past but it is no longer working. She is scheduled for hysterectomy sometime in 2021 with Dr. Corinna Capra. She also has urgency and mild urge incontinence. She drinks mostly water during the day and has an occasional cup of coffee. She denies UTIs, hematuria or stones.     ALLERGIES: NKDA    MEDICATIONS: Zyrtec 10 mg capsule  Benicar 20 mg tablet  Magnesium  Protonix 40 mg tablet, delayed release  Vitamin D3 1,250 mcg (50,000 unit) capsule     GU PSH: Cystoscopy - 06/03/2019       Radiation Notes: ablation for tubal polyps   NON-GU PSH: Cholecystectomy (laparoscopic) D & C After Delivery Foot surgery (unspecified)     GU PMH: Pelvic/perineal pain - 07/13/2019 Mixed incontinence - 06/29/2019, Discussed with patient pelvic floor physical therapy for both stress and urge urinary incontinence. Patient is also scheduled for hysterectomy in 2021 and we discussed doing a combined mid urethral sling at this time. I will schedule her come back for cystoscopy and pelvic exam to demonstrate stress urinary continence. We briefly discussed the risk  benefits of a mid urethral sling., - 05/07/2019 Stress Incontinence - 06/29/2019 Urge incontinence - 06/29/2019 Urinary Urgency - 05/07/2019      PMH Notes: bleeding disorder   NON-GU PMH: Constipation, unspecified - 07/13/2019, - 07/12/2019 Muscle weakness (generalized) - 07/13/2019, - 07/12/2019, - 06/29/2019 Other muscle spasm - 07/13/2019, - 07/12/2019, - 06/29/2019 Other specified disorders of muscle - 07/13/2019, - 07/12/2019, - 06/29/2019 Superficial (introital) dyspareunia - 07/12/2019 Asthma GERD Hypertension    FAMILY HISTORY: 1 Daughter - Daughter 1 son - Son Bladder Cancer - Father Breast Cancer - Mother Diabetes - Father Hypertension - Father   SOCIAL HISTORY: Marital Status: Married Preferred Language: English; Ethnicity: Not Hispanic Or Latino; Race: White Current Smoking Status: Patient has never smoked.   Tobacco Use Assessment Completed: Used Tobacco in last 30 days? Social Drinker.  Does not drink caffeine.    REVIEW OF SYSTEMS:    GU Review Female:   Patient denies frequent urination, hard to postpone urination, burning /pain with urination, get up at night to urinate, leakage of urine, stream starts and stops, trouble starting your stream, have to strain to urinate, and being pregnant.  Gastrointestinal (Upper):   Patient denies nausea, vomiting, and indigestion/ heartburn.  Gastrointestinal (Lower):   Patient denies diarrhea and constipation.  Constitutional:   Patient denies fever, night sweats, weight loss, and fatigue.  Skin:   Patient denies skin rash/ lesion and itching.  Eyes:   Patient denies blurred vision and double vision.  Ears/ Nose/ Throat:   Patient denies sore throat and sinus problems.  Hematologic/Lymphatic:   Patient denies swollen glands and easy  bruising.  Cardiovascular:   Patient denies leg swelling and chest pains.  Respiratory:   Patient denies cough and shortness of breath.  Endocrine:   Patient denies excessive thirst.  Musculoskeletal:    Patient denies back pain and joint pain.  Neurological:   Patient denies headaches and dizziness.  Psychologic:   Patient denies depression and anxiety.   VITAL SIGNS:      08/24/2019 07:55 AM  Weight 188 lb / 85.28 kg  Height 65 in / 165.1 cm  BP 117/85 mmHg  Heart Rate 74 /min  Temperature 97.1 F / 36.1 C  BMI 31.3 kg/m   MULTI-SYSTEM PHYSICAL EXAMINATION:    Constitutional: Well-nourished. No physical deformities. Normally developed. Good grooming.  Respiratory: Normal breath sounds. No labored breathing, no use of accessory muscles.   Cardiovascular: Regular rate and rhythm. No murmur, no gallop. Normal temperature, normal extremity pulses, no swelling, no varicosities.   Neurologic / Psychiatric: Oriented to time, oriented to place, oriented to person. No depression, no anxiety, no agitation.  Musculoskeletal: Normal gait and station of head and neck.     PAST DATA REVIEWED:  Source Of History:  Patient  Records Review:   Previous Patient Records  Urine Test Review:   Urinalysis   PROCEDURES:          Urinalysis - 81003 Dipstick Dipstick Cont'd  Color: Yellow Bilirubin: Neg  Appearance: Clear Ketones: Neg  Specific Gravity: 1.025 Blood: Neg  pH: 6.0 Protein: Neg  Glucose: Neg Urobilinogen: 0.2    Nitrites: Neg    Leukocyte Esterase: Neg    Notes:      ASSESSMENT:      ICD-10 Details  1 GU:   Mixed incontinence - N39.46   2   Stress Incontinence - N39.3 Chronic, Worsening   PLAN:           Orders Labs Urine Culture  Lab Notes: UA , UC per Azucena Fallen           Document Letter(s):  Created for Patient: Clinical Summary         Notes:   Urinalysis today is without infectious parameters, but precautionary culture was sent. We reviewed upcoming procedure in detail and I answered all of her questions to the best of my ability. She will follow up for scheduled surgical procedure, as previously planned. Return precautions were reviewed in the interim.

## 2019-08-27 NOTE — Discharge Instructions (Signed)
Post bladder sling instructions Ann Ramos)  Catheter care:  You may or may not go home with a catheter or tube in your bladder. If you or urinating normally, you will probably not need a tube. If you're not emptying normally, some form of drainage is needed. The options include a catheter from the abdomen (called SP tube), a catheter from the urethra, or a self-catheterization routine at timed intervals. These will be discussed with you before your discharge. The type depends on your individual case and preferences. Ask [place] if you have any questions about the catheter management.  Diet:  You may return to your normal diet within 24 hours following your surgery. You may note some mild nausea and possibly vomiting the first 6-8 hours following surgery. This is usually due to the side effects of anesthesia, and will disappear quite soon. I would suggest clear liquids and a very light meal the first evening following your surgery.  Activity:  Your physical activity is to be restricted, especially during the first 2 weeks home. During this time use the following guidelines:   No lifting heavy objects (anything greater than 10 pounds).  No driving a car and limit long car rides.  No strenuous exercise, limits stairclimbing to a minimum.  Bowels:  You may need a stool softener and. A bowel movement every other day is reasonable. Use a mild laxative if needed, such as milk of magnesia 2-3 tablespoons, or 2 Dulcolax tablets. Call if you continue to have problems. If you had been taking narcotics for pain, before, during or after your surgery, you may be constipated. Take a laxative if necessary.  Medication:  You should resume your pre-surgery medications unless told not to. In addition you may be given an antibiotic to prevent or treat infection. Antibiotics are not always necessary. Pain pills ((hydrocodone or oxycodone) may also be given to help with the incision and catheter discomfort. Tylenol  (acetaminophen) or Advil (ibuprofen) which have no narcotics and are better if the pain is not too bad. All medication should be taken as prescribed until the bottles are finished unless you are having an unusual reaction to one of the drugs.  Problems you should report to [place]:  a. Fever greater than 101F. b. Heavy bleeding, or clots (see notes above about blood in urine). c. Inability to urinate. d. Drug reactions (hives, rash, nausea, vomiting, diarrhea). e. Severe burning or pain with urination that is not improving.

## 2019-08-28 NOTE — Anesthesia Preprocedure Evaluation (Addendum)
Anesthesia Evaluation  Patient identified by MRN, date of birth, ID band Patient awake    History of Anesthesia Complications (+) PONV and history of anesthetic complications  Airway        Dental   Pulmonary asthma ,           Cardiovascular hypertension, Pt. on medications      Neuro/Psych    GI/Hepatic GERD  Medicated and Controlled,  Endo/Other    Renal/GU      Musculoskeletal  (+) Arthritis ,   Abdominal   Peds  Hematology Von Willebrand dx type 2N   Anesthesia Other Findings   Reproductive/Obstetrics                           Anesthesia Physical Anesthesia Plan  ASA:   Anesthesia Plan:    Post-op Pain Management:    Induction:   PONV Risk Score and Plan:   Airway Management Planned:   Additional Equipment:   Intra-op Plan:   Post-operative Plan:   Informed Consent:   Plan Discussed with:   Anesthesia Plan Comments: (Patient with hx of Von Willebrand disease. Has had evidence of excessive bleeding after endometrial ablations as well as dental extractions. Did not tolerate DDAVP due to allergic reaction in 2013. Has not seen a hematologist since 2013. Discussed case with Drs. Corinna Capra and [facility]. I have recommended patient undergo evaluation by hematologist prior to this planned intraabdominal surgery and likely will need to reschedule in hospital setting after this occurs. CASE CANCELLED.  Erasmo Downer, MD)        Anesthesia Quick Evaluation

## 2019-08-29 NOTE — H&P (Signed)
Ann Ramos presents for LAVH, BS and incontinence procedure by Dr Claudia Desanctis. She has had problems with Menorrhagia.  Had EM ablation and now has AUB and pain, not controlled with OCAs.  Also dysmneorrhea and Dysparunia.  She desires LAVH/BS. Also with SUI without improvement with Kegals.  [place] Procedure with Urology.   Vitals Wt: 192.2 lbs 08/24/2019 08:59 am Ht: 5 ft 5 in 08/24/2019 08:59 am BMI: 32 08/24/2019 08:59 am BP: 120/84 08/24/2019 09:00 am Allergies Reviewed Allergies NKDA Medications Reviewed Medications Allegra Allergy 180 mg tablet 180 mg every day. 04/21/19   entered Carloyn Manner Benicar 20 mg tablet 1 tablet(s) every day. 08/24/19   entered Elvina Mattes doxycycline hyclate 100 mg capsule 12/11/18   filled [facility] ezetimibe 10 mg tablet 08/10/19   filled surescripts Flovent HFA 220 mcg/actuation aerosol inhaler 08/03/19   filled surescripts losartan 100 mg tablet 05/06/18   filled [facility] losartan 50 mg tablet 02/12/19   filled [facility] mometasone 50 mcg/actuation nasal spray 08/04/19   filled surescripts pantoprazole 40 mg tablet,delayed release 40 mg every day. 08/23/19   filled surescripts Restasis 0.05 % eye drops in a dropperette 05/13/18   filled [facility] Sinus Nasal Spray 0.05 % 2 spray(s) twice a day. 04/21/19   entered Carloyn Manner tiZANidine 4 mg tablet 02/12/19   filled [facility] valACYclovir 500 mg tablet 12/05/18   filled [facility] Vitamin D 50,000 unit capsule 1 capsule(s) every week., start 02/12/2019 02/12/19   started [place] not reviewed (last reviewed 08/19/2018) Vaccine Type Date Amt. Route Site Armstrong Lot # Mfr. Exp. Date Date on VIS VIS Given Vaccinator Diphtheria, Tetanus, Pertussis Tdap 06/2014           Hepatitis A Hep A, adult 06/2013           Hepatitis B Hep B 06/2013           Influenza influenza 03/18/19           influenza, injectable,  quadrivalent 03/17/18           influenza 02/2013           influenza 03/13/05           Measles, Mumps, Rubella MMR 06/2014           Problems Reviewed Problems History of hypertension - Onset: 08/19/2018 Family History Reviewed Family History Maternal Aunt - Malignant tumor of breast (onset age: 69) Mother - Malignant tumor of breast (onset age: 41) Paternal Grandfather - Malignant melanoma of skin (onset age: 69) (died age: 64) Father - Malignant melanoma of skin (onset age: 73)   - Hypertensive disorder (onset age: 12)   - Diabetes mellitus (onset age: 49) Maternal Grandfather - Malignant tumor of pancreas (onset age: 23) (died age: 85) Social History Social History not reviewed (last reviewed 08/19/2018) Chewing tobacco: none How many days in the past year have you had a heavy drinking consumption (4+ female, 5+ female)?: 2 Smokeless Tobacco Status: Never used smokeless tobacco Tobacco Smoking Status: Never smoker Non-smoker Sexual orientation : Straight or heterosexual Surgical History Reviewed Surgical History Surgery-other - 06/2017 Derm-Skin Surgery - 12/20/2016 GYN- Endometrial Ablation - 04/17/2009 GYN- Tubal Ligation/Sterilization - 04/17/2009 GYN- D&C - 02/2005 Surgery-other - 01/22/1993 GI-Other Surgery - 02/02/1992 GYN History Reviewed GYN History Date of LMP: 04/18/2019 (Notes: S/P ablation. Pt still has bleeding issues). Date of Last Mammogram: 08/19/2018. Date of Last Pap Smear: 07/18/2017. History of Abnormal PAP: Y. History of Cervical Dysplasia: N. Sexually Active: Y.  Age at first intercourse: 24. Total lifetime partners: Less than 5. Sexual Orientation: Heterosexual. History of Sexually Transmitted Infection: N. Current Birth Control Method:: Tubal Ligation. Age at Menarche:: 22. History of Endometriosis: N. History of Fibroids: N. History of Infertility: N. History of Recurrent Ovarian Cysts: Y. Blood Type: A-. History of Breast Problems:  N. History of sexual abuse: N. History of Human Papillomavirus (HPV): N. Gardasil Vaccine: N. Intermenstrual bleeding (AUB): N. Dysmenorrhea: N. Number of children?: 2. Self Breast Exams?: Y. Frequency of cycle: Very irregular. Any History of Abnormal Pap Smears: Y. Obstetric History Obstetric History not reviewed (last reviewed 04/21/2019) TOTAL FULL PRE AB. I AB. S ECTOPICS MULTIPLE LIVING '2 2      2 ' Past Medical History Past Medical History not reviewed (last reviewed 04/21/2019) Cancer- Skin: Y Cardiology- High Blood Pressure: Y Dermatology-Other: Y ENT- Seasonal Allergies/Allergic Rhinitis: Y Endocrinology- History of Gestational Diabetes: Y Endocrinology- Vitamin Deficiency: Y GI- Gallbladder Disease: Y Hematology- Anemia: Y Hematology- Blood Clotting Disorder/Factor V Leiden: Y ID- Chicken Pox/Shingles: Y ID- Usual childhood diseases-Chicken Pox: Y Pulmonary- Asthma: Y Pulmonary- Seasonal Allergies/Allergic Rhinitis: Y Urology- Urinary Incontinence: Y Weight Management/Obesity: Y Screening None recorded. ROS Constitutional: Constitutional: no significant weight gain or loss and no fatigue or fever.  Skin: Skin: no rashes or abnormal moles.  Respiratory: Respiratory: no wheezing or dyspnea / shortness of breath.  Cardiovascular: Cardiovascular: no palpitations or chest pain.  Gastrointestinal: Gastrointestinal: no heartburn, dysphagia, nausea, vomiting, diarrhea, constipation, abdominal pain, bowel movement changes, or rectal bleeding.  Genitourinary: Genitourinary: no vaginal odor or itching; no hematuria, rash, lesion, discharge, flank pain, or trouble urinating; and incontinence and abnormal bleeding.  Endocrine: Menstrual: no menstrual problems or PMDD symptoms. Menopausal: no menopausal symptoms. Sexual: dyspareunia.  Musculoskeletal: Musculoskeletal: no muscle aches or weakness and no arthralgias/joint pain or back pain.  Neurological: Neurologic: no  headaches, dizziness, LOC, weakness, or numbness.  Psychological: Psych: no depression, alcoholism, or sleep disturbances. Physical Exam Patient is a 49 year old female.  Constitutional: *General Appearance: healthy-appearing, well-nourished, and well-developed.  Head: Head: normocephalic.  Neck: *Thyroid: non-tender and no enlargement.  Lymph Nodes: *Palpation: normal.  Cardiovascular: *Auscultation: RRR and no murmur. *Peripheral Vascular: no varicosities or edema.  Lungs: *Auscultation: clear to auscultation. Inspection: normal and normal respiratory rate.  *Breast: Bilateral: no tenderness, skin changes, abnormal nipple secretions, or masses palpable and nipple appearance: normal. Right Breast: normal. Left Breast: normal.  Abdomen: *Inspection/Palpation/Auscultation: no tenderness, rebound, guarding, hepatomegaly, or splenomegaly and non-distended and soft. *Hernia: none palpated.  Back: Appearance normal. Palpation no costovertebral angle tenderness.  Female Genitalia: Vulva: no masses, atrophy, or lesions. Mons: no erythema, excoriation, atrophy, lesions, masses, swelling, tenderness, or vesicles/ ulcers and normal. Labia Majora: no erythema, excoriation, atrophy, discoloration, lesions, masses, swelling, tenderness, or vesicles/ ulcers and normal. Labia Minora: no erythema, excoriation, atrophy, discoloration, lesions, vesicles, masses, swelling, or tenderness and normal. Introitus: normal. Bartholin's Gland: normal. *Vagina: no discharge, erythema, atrophy, lesions, ulcers, swelling, masses, tenderness, prolapse, or blood present and normal. *Cervix: no lesions, discharge, bleeding, or cervical motion tenderness and grossly normal. *Uterus: normal size and contour and midline, mobile, non-tender, and no uterine prolapse. *Urethral Meatus/ Urethra: no discharge, masses, or tenderness and normal meatus and well supported urethra. *Bladder: non-distended, non-tender, and no palpable  mass. *Adnexa/Parametria: no tenderness or mass palpable.  Extremities: Legs: normal.  Skin: *Appearance: no rashes or lesions.  Psychiatric: *Orientation: to person, place, and time. *Mood and Affect: normal mood and affect and active and alert. Assessment /  Plan 1. Pain in pelvis, dysparunia, and dysmenorrhea. n  2. Abnormal uterine bleeding - s/p ablation. since not benefitting from ocas, We discussed option at length. she wants to proceed with LAVH BS and ideally would like SUI sling tx combined. Discussed the risks and benefits of the procedure including but not limited to infection, bleeding, damage to bowel, bladder, ureters, and risks associated with blood transfusion. We reviewed the procedure at length including the recovery. All here questions were answered and she gives informed consent.   3. Female stress incontinence - no improvement with kegals. . wants surgical repair at time of LAVH. Dr Claudia Desanctis to perform incontinence procedure after LAVH. Had preop with her already.

## 2019-08-30 ENCOUNTER — Encounter (HOSPITAL_BASED_OUTPATIENT_CLINIC_OR_DEPARTMENT_OTHER)
Admission: RE | Disposition: A | Discharge status: Home / Self Care | Payer: Self-pay | Source: Home / Self Care | Attending: Obstetrics and Gynecology

## 2019-08-30 ENCOUNTER — Ambulatory Visit (HOSPITAL_BASED_OUTPATIENT_CLINIC_OR_DEPARTMENT_OTHER)
Admission: RE | Admit: 2019-08-30 | Discharge: 2019-08-30 | Disposition: A | Discharge status: Home / Self Care | Payer: Commercial Managed Care - PPO | Attending: Obstetrics and Gynecology | Admitting: Obstetrics and Gynecology

## 2019-08-30 ENCOUNTER — Encounter (HOSPITAL_BASED_OUTPATIENT_CLINIC_OR_DEPARTMENT_OTHER): Payer: Self-pay | Admitting: Obstetrics and Gynecology

## 2019-08-30 DIAGNOSIS — D6851 Activated protein C resistance: Secondary | ICD-10-CM | POA: Insufficient documentation

## 2019-08-30 DIAGNOSIS — Z539 Procedure and treatment not carried out, unspecified reason: Secondary | ICD-10-CM | POA: Diagnosis present

## 2019-08-30 DIAGNOSIS — N946 Dysmenorrhea, unspecified: Secondary | ICD-10-CM | POA: Insufficient documentation

## 2019-08-30 DIAGNOSIS — N939 Abnormal uterine and vaginal bleeding, unspecified: Secondary | ICD-10-CM | POA: Diagnosis not present

## 2019-08-30 DIAGNOSIS — K219 Gastro-esophageal reflux disease without esophagitis: Secondary | ICD-10-CM | POA: Insufficient documentation

## 2019-08-30 DIAGNOSIS — I10 Essential (primary) hypertension: Secondary | ICD-10-CM | POA: Insufficient documentation

## 2019-08-30 DIAGNOSIS — Z9889 Other specified postprocedural states: Secondary | ICD-10-CM | POA: Diagnosis not present

## 2019-08-30 DIAGNOSIS — R102 Pelvic and perineal pain: Secondary | ICD-10-CM | POA: Insufficient documentation

## 2019-08-30 DIAGNOSIS — N941 Unspecified dyspareunia: Secondary | ICD-10-CM | POA: Insufficient documentation

## 2019-08-30 DIAGNOSIS — Z79899 Other long term (current) drug therapy: Secondary | ICD-10-CM | POA: Diagnosis not present

## 2019-08-30 DIAGNOSIS — D680 Von Willebrand's disease: Secondary | ICD-10-CM | POA: Diagnosis not present

## 2019-08-30 DIAGNOSIS — N3946 Mixed incontinence: Secondary | ICD-10-CM | POA: Insufficient documentation

## 2019-08-30 DIAGNOSIS — J45909 Unspecified asthma, uncomplicated: Secondary | ICD-10-CM | POA: Diagnosis not present

## 2019-08-30 DIAGNOSIS — Z90710 Acquired absence of both cervix and uterus: Secondary | ICD-10-CM | POA: Diagnosis present

## 2019-08-30 LAB — TYPE AND SCREEN
ABO/RH(D): A NEG
Antibody Screen: NEGATIVE

## 2019-08-30 LAB — POCT PREGNANCY, URINE: Preg Test, Ur: NEGATIVE

## 2019-08-30 SURGERY — HYSTERECTOMY, VAGINAL, LAPAROSCOPY-ASSISTED, WITH SALPINGECTOMY
Anesthesia: General

## 2019-08-30 SURGERY — CREATION, PUBOVAGINAL SLING
Anesthesia: Choice

## 2019-08-30 MED ORDER — SODIUM CHLORIDE 0.9 % IV SOLN
INTRAVENOUS | Status: AC
  Filled 2019-08-30: qty 2

## 2019-08-30 MED ORDER — LIDOCAINE 2% (20 MG/ML) 5 ML SYRINGE
INTRAMUSCULAR | Status: AC
  Filled 2019-08-30: qty 5

## 2019-08-30 MED ORDER — LACTATED RINGERS IV SOLN
INTRAVENOUS | Status: DC
  Filled 2019-08-30: qty 1000

## 2019-08-30 MED ORDER — FENTANYL CITRATE (PF) 250 MCG/5ML IJ SOLN
INTRAMUSCULAR | Status: AC
  Filled 2019-08-30: qty 5

## 2019-08-30 MED ORDER — SODIUM CHLORIDE 0.9 % IV SOLN
2.0000 g | INTRAVENOUS | Status: DC
  Filled 2019-08-30: qty 2

## 2019-08-30 MED ORDER — GLYCOPYRROLATE PF 0.2 MG/ML IJ SOSY
PREFILLED_SYRINGE | INTRAMUSCULAR | Status: AC
  Filled 2019-08-30: qty 1

## 2019-08-30 MED ORDER — KETOROLAC TROMETHAMINE 30 MG/ML IJ SOLN
INTRAMUSCULAR | Status: AC
  Filled 2019-08-30: qty 1

## 2019-08-30 MED ORDER — DEXAMETHASONE SODIUM PHOSPHATE 10 MG/ML IJ SOLN
INTRAMUSCULAR | Status: AC
  Filled 2019-08-30: qty 1

## 2019-08-30 MED ORDER — ONDANSETRON HCL 4 MG/2ML IJ SOLN
INTRAMUSCULAR | Status: AC
  Filled 2019-08-30: qty 2

## 2019-08-30 MED ORDER — MIDAZOLAM HCL 2 MG/2ML IJ SOLN
INTRAMUSCULAR | Status: AC
  Filled 2019-08-30: qty 2

## 2019-08-30 MED ORDER — PROPOFOL 10 MG/ML IV BOLUS
INTRAVENOUS | Status: AC
  Filled 2019-08-30: qty 20

## 2019-08-30 SURGICAL SUPPLY — 61 items
ADH SKN CLS APL DERMABOND .7 (GAUZE/BANDAGES/DRESSINGS)
BAG DRN RND TRDRP ANRFLXCHMBR (UROLOGICAL SUPPLIES)
BAG URINE DRAIN 2000ML AR STRL (UROLOGICAL SUPPLIES) IMPLANT
BLADE CLIPPER SENSICLIP SURGIC (BLADE) ×1 IMPLANT
BLADE SURG 15 STRL LF DISP TIS (BLADE) ×2 IMPLANT
BLADE SURG 15 STRL SS (BLADE)
CABLE HIGH FREQUENCY MONO STRZ (ELECTRODE) IMPLANT
CANISTER SUCT 1200ML W/VALVE (MISCELLANEOUS) ×1 IMPLANT
CATH FOLEY 2WAY SLVR  5CC 14FR (CATHETERS)
CATH FOLEY 2WAY SLVR 5CC 14FR (CATHETERS) ×1 IMPLANT
CATH ROBINSON RED A/P 16FR (CATHETERS) ×1 IMPLANT
CLOSURE WOUND 1/4X4 (GAUZE/BANDAGES/DRESSINGS)
COVER BACK TABLE 60X90IN (DRAPES) ×1 IMPLANT
COVER MAYO STAND STRL (DRAPES) ×3 IMPLANT
COVER WAND RF STERILE (DRAPES) ×2 IMPLANT
DECANTER SPIKE VIAL GLASS SM (MISCELLANEOUS) IMPLANT
DERMABOND ADVANCED (GAUZE/BANDAGES/DRESSINGS)
DERMABOND ADVANCED .7 DNX12 (GAUZE/BANDAGES/DRESSINGS) ×2 IMPLANT
DRSG OPSITE POSTOP 3X4 (GAUZE/BANDAGES/DRESSINGS) ×1 IMPLANT
DURAPREP 26ML APPLICATOR (WOUND CARE) ×1 IMPLANT
ELECT REM PT RETURN 9FT ADLT (ELECTROSURGICAL)
ELECTRODE REM PT RTRN 9FT ADLT (ELECTROSURGICAL) IMPLANT
FORCEPS CUTTING 45CM 5MM (CUTTING FORCEPS) IMPLANT
GAUZE 4X4 16PLY RFD (DISPOSABLE) ×1 IMPLANT
GLOVE BIO SURGEON STRL SZ7.5 (GLOVE) ×2 IMPLANT
GLOVE BIO SURGEON STRL SZ8 (GLOVE) ×1 IMPLANT
GLOVE BIOGEL PI IND STRL 6.5 (GLOVE) ×2 IMPLANT
GLOVE BIOGEL PI IND STRL 7.0 (GLOVE) ×2 IMPLANT
GLOVE BIOGEL PI INDICATOR 6.5 (GLOVE)
GLOVE BIOGEL PI INDICATOR 7.0 (GLOVE)
GLOVE SURG ORTHO 8.0 STRL STRW (GLOVE) ×3 IMPLANT
GOWN STRL REUS W/ TWL XL LVL3 (GOWN DISPOSABLE) ×1 IMPLANT
GOWN STRL REUS W/TWL XL LVL3 (GOWN DISPOSABLE)
HOLDER FOLEY CATH W/STRAP (MISCELLANEOUS) ×2 IMPLANT
KIT TURNOVER CYSTO (KITS) ×2 IMPLANT
LIGASURE LAP L-HOOKWIRE 5 44CM (INSTRUMENTS) IMPLANT
MANIFOLD NEPTUNE II (INSTRUMENTS) IMPLANT
NEEDLE INSUFFLATION 120MM (ENDOMECHANICALS) ×2 IMPLANT
PACK TRENDGUARD 450 HYBRID PRO (MISCELLANEOUS) IMPLANT
PLUG CATH AND CAP STER (CATHETERS) ×1 IMPLANT
SCISSORS LAP 5X35 DISP (ENDOMECHANICALS) IMPLANT
SEALER TISSUE G2 CVD JAW 45CM (ENDOMECHANICALS) IMPLANT
SET IRRIG TUBING LAPAROSCOPIC (IRRIGATION / IRRIGATOR) IMPLANT
SOLUTION ELECTROLUBE (MISCELLANEOUS) IMPLANT
STRIP CLOSURE SKIN 1/4X4 (GAUZE/BANDAGES/DRESSINGS) IMPLANT
SUT MNCRL 0 MO-4 VIOLET 18 CR (SUTURE) ×2 IMPLANT
SUT MNCRL 0 VIOLET 6X18 (SUTURE) ×1 IMPLANT
SUT MNCRL AB 0 CT1 27 (SUTURE) IMPLANT
SUT MON AB 2-0 CT1 36 (SUTURE) IMPLANT
SUT MONOCRYL 0 6X18 (SUTURE)
SUT MONOCRYL 0 MO 4 18  CR/8 (SUTURE)
SUT SILK 2 0 SH (SUTURE) IMPLANT
SUT VIC AB 2-0 CT1 27 (SUTURE)
SUT VIC AB 2-0 CT1 TAPERPNT 27 (SUTURE) ×2 IMPLANT
SUT VIC AB 2-0 SH 27 (SUTURE)
SUT VIC AB 2-0 SH 27XBRD (SUTURE) IMPLANT
SUT VICRYL 4-0 PS2 18IN ABS (SUTURE) ×1 IMPLANT
SUT VICRYL RAPIDE 3 0 (SUTURE) ×1 IMPLANT
TRENDGUARD 450 HYBRID PRO PACK (MISCELLANEOUS)
TROCAR OPTI TIP 5M 100M (ENDOMECHANICALS) ×1 IMPLANT
TROCAR XCEL DIL TIP R 11M (ENDOMECHANICALS) ×1 IMPLANT

## 2019-09-02 ENCOUNTER — Telehealth: Payer: Self-pay | Admitting: Hematology and Oncology

## 2019-09-02 NOTE — Telephone Encounter (Signed)
Received a new hem referral for VonWillebrand's disease. Pt has been cld and scheduled to see Dr. Lorenso Courier on 3/22 at 8am. Pt aware to arrive 15 minutes early.

## 2019-09-03 ENCOUNTER — Other Ambulatory Visit: Payer: Self-pay

## 2019-09-03 ENCOUNTER — Encounter (HOSPITAL_BASED_OUTPATIENT_CLINIC_OR_DEPARTMENT_OTHER): Payer: Self-pay | Admitting: Orthopedic Surgery

## 2019-09-03 ENCOUNTER — Other Ambulatory Visit (HOSPITAL_COMMUNITY): Payer: Self-pay | Admitting: Orthopedic Surgery

## 2019-09-03 NOTE — Progress Notes (Signed)
Pt's pmh and recent anesthesia cancellation in regards to needing hematology clearance reviewed with Dr. Kalman Shan. Pt has appt to see Dr Lorenso Courier on 09/06/19 and we will plan to follow those recommendations -pt has intolerance to DDAVP in the past. EKG BMET CBC done 08/24/19. +Covid on 07/14/19- (husband faxing report). Pt will not need PAT appt or Covid test prior to [facility]. Pre op call done and dos instructions reviewed pt. Verbalized understanding of plan of care.

## 2019-09-06 ENCOUNTER — Encounter: Payer: Self-pay | Admitting: Hematology and Oncology

## 2019-09-06 ENCOUNTER — Inpatient Hospital Stay: Payer: Commercial Managed Care - PPO | Attending: Hematology and Oncology | Admitting: Hematology and Oncology

## 2019-09-06 ENCOUNTER — Other Ambulatory Visit: Payer: Self-pay

## 2019-09-06 ENCOUNTER — Inpatient Hospital Stay: Payer: Commercial Managed Care - PPO

## 2019-09-06 VITALS — BP 108/74 | HR 75 | Temp 97.8°F | Resp 16 | Ht 65.0 in | Wt 185.0 lb

## 2019-09-06 DIAGNOSIS — D6800 Von Willebrand disease, unspecified: Secondary | ICD-10-CM

## 2019-09-06 DIAGNOSIS — I10 Essential (primary) hypertension: Secondary | ICD-10-CM | POA: Diagnosis not present

## 2019-09-06 DIAGNOSIS — Z85820 Personal history of malignant melanoma of skin: Secondary | ICD-10-CM | POA: Insufficient documentation

## 2019-09-06 LAB — CMP (CANCER CENTER ONLY)
ALT: 63 U/L — ABNORMAL HIGH (ref 0–44)
AST: 28 U/L (ref 15–41)
Albumin: 3.7 g/dL (ref 3.5–5.0)
Alkaline Phosphatase: 65 U/L (ref 38–126)
Anion gap: 9 (ref 5–15)
BUN: 9 mg/dL (ref 6–20)
CO2: 23 mmol/L (ref 22–32)
Calcium: 9.1 mg/dL (ref 8.9–10.3)
Chloride: 110 mmol/L (ref 98–111)
Creatinine: 0.68 mg/dL (ref 0.44–1.00)
GFR, Est AFR Am: >60 mL/min (ref >60)
GFR, Estimated: >60 mL/min (ref >60)
Glucose, Bld: 110 mg/dL — ABNORMAL HIGH (ref 70–99)
Potassium: 3.9 mmol/L (ref 3.5–5.1)
Sodium: 142 mmol/L (ref 135–145)
Total Bilirubin: 0.7 mg/dL (ref 0.3–1.2)
Total Protein: 6.4 g/dL — ABNORMAL LOW (ref 6.5–8.1)

## 2019-09-06 LAB — CBC WITH DIFFERENTIAL (CANCER CENTER ONLY)
Abs Immature Granulocytes: 0.02 10*3/uL (ref 0.00–0.07)
Basophils Absolute: 0 10*3/uL (ref 0.0–0.1)
Basophils Relative: 0 %
Eosinophils Absolute: 0.1 10*3/uL (ref 0.0–0.5)
Eosinophils Relative: 2 %
HCT: 41.5 % (ref 36.0–46.0)
Hemoglobin: 13.7 g/dL (ref 12.0–15.0)
Immature Granulocytes: 0 %
Lymphocytes Relative: 26 %
Lymphs Abs: 1.9 10*3/uL (ref 0.7–4.0)
MCH: 28.7 pg (ref 26.0–34.0)
MCHC: 33 g/dL (ref 30.0–36.0)
MCV: 86.8 fL (ref 80.0–100.0)
Monocytes Absolute: 0.4 10*3/uL (ref 0.1–1.0)
Monocytes Relative: 6 %
Neutro Abs: 4.9 10*3/uL (ref 1.7–7.7)
Neutrophils Relative %: 66 %
Platelet Count: 218 10*3/uL (ref 150–400)
RBC: 4.78 MIL/uL (ref 3.87–5.11)
RDW: 13.3 % (ref 11.5–15.5)
WBC Count: 7.4 10*3/uL (ref 4.0–10.5)
nRBC: 0 % (ref 0.0–0.2)

## 2019-09-06 LAB — PROTIME-INR
INR: 0.9 (ref 0.8–1.2)
Prothrombin Time: 11.5 seconds (ref 11.4–15.2)

## 2019-09-06 LAB — APTT: aPTT: 26 seconds (ref 24–36)

## 2019-09-06 NOTE — Progress Notes (Signed)

## 2019-09-06 NOTE — Progress Notes (Signed)
[place] [phone]   Fax:([phone]) [place] NOTE  Patient Care Team: Myrlene Broker, MD as PCP - General (Family Medicine)  Hematological/Oncological History # Possible History of von Willebrand Syndrome 2N 1) 04/30/2012: estabilshed care with Dr. Jamse Arn at [facility]. Factor VIII of 68%, vWF antigen 73%, Ristocetin CoFactor 53%, vWF multimers:normal.  2) 05/26/2012: Factor VIII 128%, vWF antigen 122%, Ristocetin CoFactor 91%. Pre DDAVP dose. Patient had reaction to DDAVP (flushing, burning sensation. Received benadryl IV). Factor VIII increased to 289, vW antigen 212, Ristocetin 204. Subsequent check later that day showed Factor VIII at 59%.  3) Lost to follow up.  4) 06/26/2017: underwent plantar fascia release. Procedure was without bleeding complication.  4) 09/06/2019: establish care with Dr. Lorenso Courier prior to planned surgery on ankle fracture.   CHIEF COMPLAINTS/PURPOSE OF CONSULTATION:  "Preoperative evaluation of possible bleeding disorder "  HISTORY OF PRESENTING ILLNESS:  Ann Ramos 48 y.o. female with medical history significant for GERD, HTN, and Stage I melanoma who presents for evaluation for a possible bleeding disorder.    On review of the previous records Ann Ramos initially established with the [facility] on 04/30/2012 with Dr.Odogwu.  Patient was initially thought to have either hemophilia a, subclinical factor deficiency, or von Willebrand syndrome type II N.  Patient did undergo a DDAVP test on 05/26/2012, however prior to the start of the test the factor VIII level was normal 128% and increased to 289%.  The patient did develop a reaction to the DDAVP including flushing and hot feeling.  She received IV Benadryl x2 doses at that time.  She was subsequently lost to follow-up and in the interim underwent a plantar fasciitis release surgery without any complications.  The patient reestablish care  today on 09/06/2019 prior to a planned ankle surgery on 09/09/2019.  On exam today Ann Ramos notes that she feels well.  She notes that last Wednesday she slipped and fell into a ditch while taking out the trash at 9:30 at night.  She reportedly broke her ankle in 3 places and developed quite a swollen/bruised right ankle.  She has been evaluated by orthopedic surgery who is planning for a procedure to be formed on 09/09/2019.  On further discussion of her bleeding disorder the patient notes that she is always had trouble with heavy periods as she had trouble stopping them.  She was scheduled for hysterectomy on Monday of last week, however after finding out that she had a possible bleeding disorder the procedure was canceled.  The patient does report that she had wisdom teeth removal with some increased bleeding but did not require any factor support at that time.  Additionally the patient has undergone a cholecystectomy as well as a plantar fasciitis release 2 years ago without any complication.  The patient has never required any factor support and has only ever done a DDAVP trial run.  In regards to family history the patient notes that the maternal grandmother had multiple bleeding issues, particularly after hysterectomy.  No one else in the family appears to have medical conditions of this sort.  On further discussion the patient denies any fevers, chills, sweats, nausea, vomiting or diarrhea.  She reports he is otherwise well.  A full 10 point ROS is listed below.  MEDICAL HISTORY:  Past Medical History:  Diagnosis Date  . Allergy   . Anemia   . Arthritis   . Asthma   . Cancer (  [place])    stage one melanoma in June 2018  . GERD (gastroesophageal reflux disease)    takes protonix  . Hypertension   . Pneumonia   . PONV (postoperative nausea and vomiting)   . Von Willebrand disease ([place]) Type 2N    being followed for borderline status     SURGICAL HISTORY: Past Surgical History:  Procedure  Laterality Date  . CHOLECYSTECTOMY    . Dilation and Cutterege     polys  . MELANOMA EXCISION     left arm  . PLANTAR FASCIA RELEASE Left 06/26/2017   Procedure: PLANTAR FASCIA RELEASE;  Surgeon: Wylene Simmer, MD;  Location: [place];  Service: Orthopedics;  Laterality: Left;  . TUBAL LIGATION    . uterine ablation      SOCIAL HISTORY: Social History   Socioeconomic History  . Marital status: Married    Spouse name: Not on file  . Number of children: Not on file  . Years of education: Not on file  . Highest education level: Not on file  Occupational History  . Not on file  Tobacco Use  . Smoking status: Never Smoker  . Smokeless tobacco: Never Used  Substance and Sexual Activity  . Alcohol use: Yes    Comment: Occasional alcohol  . Drug use: No  . Sexual activity: Not on file  Other Topics Concern  . Not on file  Social History Narrative  . Not on file   Social Determinants of Health   Financial Resource Strain:   . Difficulty of Paying Living Expenses:   Food Insecurity:   . Worried About Charity fundraiser in the Last Year:   . Arboriculturist in the Last Year:   Transportation Needs:   . Film/video editor (Medical):   Marland Kitchen Lack of Transportation (Non-Medical):   Physical Activity:   . Days of Exercise per Week:   . Minutes of Exercise per Session:   Stress:   . Feeling of Stress :   Social Connections:   . Frequency of Communication with Friends and Family:   . Frequency of Social Gatherings with Friends and Family:   . Attends Religious Services:   . Active Member of Clubs or Organizations:   . Attends Archivist Meetings:   Marland Kitchen Marital Status:   Intimate Partner Violence:   . Fear of Current or Ex-Partner:   . Emotionally Abused:   Marland Kitchen Physically Abused:   . Sexually Abused:     FAMILY HISTORY: Family History  Problem Relation Age of Onset  . Cancer Mother        Breast   . Hyperlipidemia Mother   . Diabetes Father     . Hyperlipidemia Father   . Hypertension Father   . Varicose Veins Father     ALLERGIES:  is allergic to other; adhesive [tape]; and latex.  MEDICATIONS:  Current Outpatient Medications  Medication Sig Dispense Refill  . HYDROcodone-acetaminophen (NORCO/VICODIN) 5-325 MG tablet Take 1 tablet by mouth every 6 (six) hours as needed for moderate pain.    . meloxicam (MOBIC) 7.5 MG tablet Take 7.5 mg by mouth in the morning and at bedtime.    Marland Kitchen albuterol (VENTOLIN HFA) 108 (90 Base) MCG/ACT inhaler Inhale into the lungs every 6 (six) hours as needed for wheezing or shortness of breath.    . cetirizine (ZYRTEC) 10 MG tablet Take 10 mg by mouth daily.    Marland Kitchen ezetimibe (ZETIA) 10 MG tablet Take  10 mg by mouth daily.    . mometasone (NASONEX) 50 MCG/ACT nasal spray Place 2 sprays into the nose daily.     Marland Kitchen olmesartan (BENICAR) 20 MG tablet Take 20 mg by mouth daily.    . pantoprazole (PROTONIX) 40 MG tablet Take 40 mg by mouth daily.    Marland Kitchen tiZANidine (ZANAFLEX) 4 MG capsule Take 4 mg by mouth 3 (three) times daily as needed for muscle spasms.   6   No current facility-administered medications for this visit.    REVIEW OF SYSTEMS:   Constitutional: ( - ) fevers, ( - )  chills , ( - ) night sweats Eyes: ( - ) blurriness of vision, ( - ) double vision, ( - ) watery eyes Ears, nose, mouth, throat, and face: ( - ) mucositis, ( - ) sore throat Respiratory: ( - ) cough, ( - ) dyspnea, ( - ) wheezes Cardiovascular: ( - ) palpitation, ( - ) chest discomfort, ( - ) lower extremity swelling Gastrointestinal:  ( - ) nausea, ( - ) heartburn, ( - ) change in bowel habits Skin: ( - ) abnormal skin rashes Lymphatics: ( - ) new lymphadenopathy, ( + ) easy bruising Neurological: ( - ) numbness, ( - ) tingling, ( - ) new weaknesses Behavioral/Psych: ( - ) mood change, ( - ) new changes  All other systems were reviewed with the patient and are negative.  PHYSICAL EXAMINATION: ECOG PERFORMANCE STATUS: 0 -  Asymptomatic  Vitals:   09/06/19 0821  BP: 108/74  Pulse: 75  Resp: 16  Temp: 97.8 F (36.6 C)  SpO2: 100%   Filed Weights   09/06/19 0821  Weight: 185 lb (83.9 kg)    GENERAL: well appearing middle aged Caucasian female in NAD  SKIN: skin color, texture, turgor are normal, no rashes or significant lesions EYES: conjunctiva are pink and non-injected, sclera clear LUNGS: clear to auscultation and percussion with normal breathing effort HEART: regular rate & rhythm and no murmurs and no lower extremity edema ABDOMEN: soft, non-tender, non-distended, normal bowel sounds Musculoskeletal: no cyanosis of digits and no clubbing  PSYCH: alert & oriented x 3, fluent speech NEURO: no focal motor/sensory deficits  LABORATORY DATA:  I have reviewed the data as listed CBC Latest Ref Rng & Units 09/06/2019 08/24/2019 06/27/2016  WBC 4.0 - 10.5 K/uL 7.4 9.2 7.8  Hemoglobin 12.0 - 15.0 g/dL 13.7 14.0 14.2  Hematocrit 36.0 - 46.0 % 41.5 42.9 42.7  Platelets 150 - 400 K/uL 218 234 221    CMP Latest Ref Rng & Units 09/06/2019 08/24/2019 06/27/2016  Glucose 70 - 99 mg/dL 110(H) 115(H) 91  BUN 6 - 20 mg/dL 9 11 13   Creatinine 0.44 - 1.00 mg/dL 0.68 0.93 0.62  Sodium 135 - 145 mmol/L 142 141 140  Potassium 3.5 - 5.1 mmol/L 3.9 3.4(L) 4.1  Chloride 98 - 111 mmol/L 110 109 105  CO2 22 - 32 mmol/L 23 23 22   Calcium 8.9 - 10.3 mg/dL 9.1 9.3 9.2  Total Protein 6.5 - 8.1 g/dL 6.4(L) - 6.7  Total Bilirubin 0.3 - 1.2 mg/dL 0.7 - 0.4  Alkaline Phos 38 - 126 U/L 65 - 45  AST 15 - 41 U/L 28 - 14  ALT 0 - 44 U/L 63(H) - 17    PATHOLOGY: None relevant to review.   RADIOGRAPHIC STUDIES: I have personally reviewed the radiological images as listed and agreed with the findings in the report. No results found.  ASSESSMENT &  PLAN Dereon Threlkeld Giron 49 y.o. female with medical history significant for GERD, HTN, and Stage I melanoma who presents for evaluation for a possible bleeding disorder.  After review the  labs and discussion with the patient the findings do not entirely support the diagnosis of mild hemophilia A or von Willebrand type 2N.  She is only had a single measurement of factor VIII which has been below the reference range of 40%.  Additionally she had normal levels of factor VIII when she arrived for her DDAVP testing.  Also reassuring is the fact that the patient has had numerous surgical procedures including wisdom teeth removal, cholecystectomy, and plantar fasciitis release without any bleeding complications.  This is often the best prognostic sign in a patient with a bleeding diathesis.  At this time I would recommend repeat testing of the factor VIII levels and von Willebrand panel.  In the event that the patient was found to have decreased factor VIII levels we could make recommendations regarding the use of recombinant factor VIII.  I would note that at this time does not appear that this will be necessary prior to her planned procedure on 09/09/2019, but before formal recommendations could be made I would like to have the above labs return.  We would recommend the patient continue to follow in our clinic at least once yearly and that she be referred to [place] promptly in the event that the planned surgery is considered.  #Low Factor VIII Levels --after review of the labs it appears the patient has a lower level of FVIII, but not low enough to be considered mild Hemophilia A. --prior labs were no consistent with vWD, though a type 2N is a possibility. Will repeat vWD tests today, including a [facility] Sendout for [id number] binding --patient has undergone numerous prior surgeries without bleeding complications and requiring factor repletion. This is reassuring for future procedures. --if Factor VIII levels are >40% there is no clear indication for supplemental factor in the peri-operative setting. I would recommend FVIII be on standby in the event a bleeding complication did develop in the perioperative  period.  --If vWD Type 2N is present the treatment is similar to FVIII deficiency alone, which is repletion of FVIII levels with recombinant factor  --await the results of the vWD/ Factor VIII levels prior to clearance and formal recommendations for surgery. Please contact our service prior to surgery to assure the labs have returned and are adequate for treatment. We will document any returned results in an addendum below. --RTC at least once per year or as soon as possible prior to a scheduled surgery.   Orders Placed This Encounter  Procedures  . CBC with Differential (Cancer Center Only)    Standing Status:   Future    Number of Occurrences:   1    Standing Expiration Date:   09/05/2020  . CMP ([facility] only)    Standing Status:   Future    Number of Occurrences:   1    Standing Expiration Date:   09/05/2020  . Protime-INR    Standing Status:   Future    Number of Occurrences:   1    Standing Expiration Date:   09/05/2020  . PTT factor inhibitor (mixing study)    Standing Status:   Future    Number of Occurrences:   1    Standing Expiration Date:   09/05/2020  . Von Willebrand panel    Standing Status:   Future  Number of Occurrences:   1    Standing Expiration Date:   09/05/2020  . Von Willebrand Multimeric    Standing Status:   Future    Number of Occurrences:   1    Standing Expiration Date:   09/05/2020  . Miscellaneous [facility] test (send-out)    Standing Status:   Future    Number of Occurrences:   1    Standing Expiration Date:   09/05/2020    Order Specific Question:   Test name / description:    Answer:   [facility] Test ID: [id number] Cruzita Lederer Wilebrand Disease Type 2N confirmatory testing).    All questions were answered. The patient knows to call the clinic with any problems, questions or concerns.  A total of more than 60 minutes were spent on this encounter and over half of that time was spent on counseling and coordination of care as outlined above.    Ledell Peoples, MD Department of Hematology/Oncology [place] at [facility] Phone: [phone] Pager: [phone] Email: [email or web address]  09/06/2019 11:49 AM

## 2019-09-07 LAB — PTT FACTOR INHIBITOR (MIXING STUDY): aPTT: 24.5 s (ref 22.9–30.2)

## 2019-09-07 LAB — VON WILLEBRAND PANEL
Coagulation Factor VIII: 184 % — ABNORMAL HIGH (ref 56–140)
Ristocetin Co-factor, Plasma: 174 % (ref 50–200)
Von Willebrand Antigen, Plasma: 168 % (ref 50–200)

## 2019-09-07 LAB — COAG STUDIES INTERP REPORT

## 2019-09-08 ENCOUNTER — Telehealth: Payer: Self-pay | Admitting: Hematology and Oncology

## 2019-09-08 LAB — MISC LABCORP TEST (SEND OUT)

## 2019-09-08 NOTE — Progress Notes (Signed)
Patient's chart and all recent blood work-up results for Von Willebrand Disease and notes from Hematology/Oncology reviewed with Dr Sabra Heck. He states that patient can be done here at [facility] for ankle fracture.

## 2019-09-08 NOTE — Telephone Encounter (Signed)
Called Mrs. Abeyta to discuss the results of her bloodwork from her last clinic visit. Findings are reassuring with normal FVIII and vWF levels. This, coupled with the patient's other uncomplicated surgeries are reassuring for moving forward with no perioperative factor support.  I would still recommend Recombinate FVIII be on standby in the event bleeding complications due develop during the surgery, but I think this is unlikely.  Patient told to call will any questions or concerns moving forward. We asked that she call to schedule a visit prior to any surgical intervention. Additionally we will follow with her about q 1 year for continued monitoring.  Ledell Peoples, MD Department of Hematology/Oncology [place] [facility] Phone: [phone] Pager: [phone] Email: [email or web address]

## 2019-09-08 NOTE — Progress Notes (Signed)
Reviewed most recent note (by Dr Lorenso Courier) with Dr Sabra Heck who says OK to proceed with surgery as scheduled. He has discussed case with Dr Lanetta Inch who may be here tomorrow for anesthesia.

## 2019-09-09 ENCOUNTER — Encounter (HOSPITAL_BASED_OUTPATIENT_CLINIC_OR_DEPARTMENT_OTHER)
Admission: RE | Disposition: A | Discharge status: Home / Self Care | Payer: Self-pay | Source: Home / Self Care | Attending: Orthopedic Surgery

## 2019-09-09 ENCOUNTER — Encounter (HOSPITAL_BASED_OUTPATIENT_CLINIC_OR_DEPARTMENT_OTHER): Payer: Self-pay | Admitting: Orthopedic Surgery

## 2019-09-09 ENCOUNTER — Other Ambulatory Visit: Payer: Self-pay

## 2019-09-09 ENCOUNTER — Ambulatory Visit (HOSPITAL_BASED_OUTPATIENT_CLINIC_OR_DEPARTMENT_OTHER): Payer: Commercial Managed Care - PPO | Admitting: Anesthesiology

## 2019-09-09 ENCOUNTER — Ambulatory Visit (HOSPITAL_BASED_OUTPATIENT_CLINIC_OR_DEPARTMENT_OTHER)
Admission: RE | Admit: 2019-09-09 | Discharge: 2019-09-09 | Disposition: A | Discharge status: Home / Self Care | Payer: Commercial Managed Care - PPO | Attending: Orthopedic Surgery | Admitting: Orthopedic Surgery

## 2019-09-09 DIAGNOSIS — K219 Gastro-esophageal reflux disease without esophagitis: Secondary | ICD-10-CM | POA: Insufficient documentation

## 2019-09-09 DIAGNOSIS — D680 Von Willebrand's disease: Secondary | ICD-10-CM | POA: Insufficient documentation

## 2019-09-09 DIAGNOSIS — Z79899 Other long term (current) drug therapy: Secondary | ICD-10-CM | POA: Diagnosis not present

## 2019-09-09 DIAGNOSIS — Z85820 Personal history of malignant melanoma of skin: Secondary | ICD-10-CM | POA: Diagnosis not present

## 2019-09-09 DIAGNOSIS — S82851A Displaced trimalleolar fracture of right lower leg, initial encounter for closed fracture: Secondary | ICD-10-CM | POA: Insufficient documentation

## 2019-09-09 DIAGNOSIS — J45909 Unspecified asthma, uncomplicated: Secondary | ICD-10-CM | POA: Insufficient documentation

## 2019-09-09 DIAGNOSIS — E785 Hyperlipidemia, unspecified: Secondary | ICD-10-CM | POA: Insufficient documentation

## 2019-09-09 DIAGNOSIS — Z791 Long term (current) use of non-steroidal anti-inflammatories (NSAID): Secondary | ICD-10-CM | POA: Insufficient documentation

## 2019-09-09 DIAGNOSIS — W19XXXA Unspecified fall, initial encounter: Secondary | ICD-10-CM | POA: Diagnosis not present

## 2019-09-09 DIAGNOSIS — I10 Essential (primary) hypertension: Secondary | ICD-10-CM | POA: Diagnosis not present

## 2019-09-09 HISTORY — PX: ORIF ANKLE FRACTURE: SHX5408

## 2019-09-09 SURGERY — OPEN REDUCTION INTERNAL FIXATION (ORIF) ANKLE FRACTURE
Anesthesia: Regional | Site: Ankle | Laterality: Right

## 2019-09-09 MED ORDER — FENTANYL CITRATE (PF) 100 MCG/2ML IJ SOLN
INTRAMUSCULAR | Status: AC
  Filled 2019-09-09: qty 2

## 2019-09-09 MED ORDER — DEXMEDETOMIDINE HCL IN NACL 200 MCG/50ML IV SOLN
INTRAVENOUS | Status: AC
  Filled 2019-09-09: qty 50

## 2019-09-09 MED ORDER — 0.9 % SODIUM CHLORIDE (POUR BTL) OPTIME
TOPICAL | Status: DC | PRN
  Administered 2019-09-09: 150 mL

## 2019-09-09 MED ORDER — LACTATED RINGERS IV SOLN: INTRAVENOUS | Status: DC

## 2019-09-09 MED ORDER — OXYCODONE HCL 5 MG PO TABS
ORAL_TABLET | ORAL | Status: AC
  Filled 2019-09-09: qty 1

## 2019-09-09 MED ORDER — ONDANSETRON HCL 4 MG/2ML IJ SOLN
INTRAMUSCULAR | Status: AC
  Filled 2019-09-09: qty 12

## 2019-09-09 MED ORDER — CEFAZOLIN SODIUM-DEXTROSE 2-4 GM/100ML-% IV SOLN
INTRAVENOUS | Status: AC
  Filled 2019-09-09: qty 100

## 2019-09-09 MED ORDER — CEFAZOLIN SODIUM-DEXTROSE 2-4 GM/100ML-% IV SOLN
2.0000 g | INTRAVENOUS | Status: AC
  Administered 2019-09-09: 2 g via INTRAVENOUS

## 2019-09-09 MED ORDER — PROPOFOL 500 MG/50ML IV EMUL
INTRAVENOUS | Status: AC
  Filled 2019-09-09: qty 150

## 2019-09-09 MED ORDER — DEXAMETHASONE SODIUM PHOSPHATE 10 MG/ML IJ SOLN
INTRAMUSCULAR | Status: AC
  Filled 2019-09-09: qty 2

## 2019-09-09 MED ORDER — ACETAMINOPHEN 500 MG PO TABS
1000.0000 mg | ORAL_TABLET | Freq: Once | ORAL | Status: AC
  Administered 2019-09-09: 1000 mg via ORAL

## 2019-09-09 MED ORDER — OXYCODONE HCL 5 MG PO TABS
5.0000 mg | ORAL_TABLET | Freq: Once | ORAL | Status: AC | PRN
  Administered 2019-09-09: 14:00:00 5 mg via ORAL

## 2019-09-09 MED ORDER — ONDANSETRON HCL 4 MG/2ML IJ SOLN
INTRAMUSCULAR | Status: DC | PRN
  Administered 2019-09-09: 4 mg via INTRAVENOUS

## 2019-09-09 MED ORDER — SODIUM CHLORIDE 0.9 % IV SOLN: INTRAVENOUS | Status: DC

## 2019-09-09 MED ORDER — MIDAZOLAM HCL 2 MG/2ML IJ SOLN
INTRAMUSCULAR | Status: AC
  Filled 2019-09-09: qty 2

## 2019-09-09 MED ORDER — PROPOFOL 10 MG/ML IV BOLUS
INTRAVENOUS | Status: AC
  Filled 2019-09-09: qty 20

## 2019-09-09 MED ORDER — FENTANYL CITRATE (PF) 100 MCG/2ML IJ SOLN
50.0000 ug | INTRAMUSCULAR | Status: DC | PRN
  Administered 2019-09-09 (×2): 50 ug via INTRAVENOUS

## 2019-09-09 MED ORDER — FENTANYL CITRATE (PF) 100 MCG/2ML IJ SOLN
25.0000 ug | INTRAMUSCULAR | Status: DC | PRN
  Administered 2019-09-09 (×2): 50 ug via INTRAVENOUS

## 2019-09-09 MED ORDER — ACETAMINOPHEN 500 MG PO TABS
ORAL_TABLET | ORAL | Status: AC
  Filled 2019-09-09: qty 2

## 2019-09-09 MED ORDER — OXYCODONE HCL 5 MG PO TABS: 5.0000 mg | ORAL_TABLET | ORAL | 0 refills | Status: AC | PRN

## 2019-09-09 MED ORDER — PROMETHAZINE HCL 25 MG/ML IJ SOLN
INTRAMUSCULAR | Status: AC
  Filled 2019-09-09: qty 1

## 2019-09-09 MED ORDER — DEXAMETHASONE SODIUM PHOSPHATE 10 MG/ML IJ SOLN
INTRAMUSCULAR | Status: DC | PRN
  Administered 2019-09-09: 10 mg via INTRAVENOUS

## 2019-09-09 MED ORDER — SENNA 8.6 MG PO TABS: 2.0000 | ORAL_TABLET | Freq: Two times a day (BID) | ORAL | 0 refills | Status: DC

## 2019-09-09 MED ORDER — VANCOMYCIN HCL 500 MG IV SOLR
INTRAVENOUS | Status: DC | PRN
  Administered 2019-09-09: 500 mg via TOPICAL

## 2019-09-09 MED ORDER — PROPOFOL 500 MG/50ML IV EMUL
INTRAVENOUS | Status: DC | PRN
  Administered 2019-09-09: 100 ug/kg/min via INTRAVENOUS

## 2019-09-09 MED ORDER — ROPIVACAINE HCL 5 MG/ML IJ SOLN
INTRAMUSCULAR | Status: DC | PRN
  Administered 2019-09-09: 15 mL via PERINEURAL
  Administered 2019-09-09: 30 mL via PERINEURAL

## 2019-09-09 MED ORDER — FENTANYL CITRATE (PF) 100 MCG/2ML IJ SOLN
INTRAMUSCULAR | Status: DC | PRN
  Administered 2019-09-09: 50 ug via INTRAVENOUS
  Administered 2019-09-09 (×2): 25 ug via INTRAVENOUS
  Administered 2019-09-09 (×2): 50 ug via INTRAVENOUS

## 2019-09-09 MED ORDER — CHLORHEXIDINE GLUCONATE 4 % EX LIQD: 60.0000 mL | Freq: Once | CUTANEOUS | Status: DC

## 2019-09-09 MED ORDER — DOCUSATE SODIUM 100 MG PO CAPS: 100.0000 mg | ORAL_CAPSULE | Freq: Two times a day (BID) | ORAL | 0 refills | Status: DC

## 2019-09-09 MED ORDER — MIDAZOLAM HCL 2 MG/2ML IJ SOLN
1.0000 mg | INTRAMUSCULAR | Status: DC | PRN
  Administered 2019-09-09: 08:00:00 2 mg via INTRAVENOUS

## 2019-09-09 MED ORDER — LIDOCAINE 2% (20 MG/ML) 5 ML SYRINGE
INTRAMUSCULAR | Status: AC
  Filled 2019-09-09: qty 10

## 2019-09-09 MED ORDER — PROPOFOL 10 MG/ML IV BOLUS
INTRAVENOUS | Status: DC | PRN
  Administered 2019-09-09: 150 mg via INTRAVENOUS

## 2019-09-09 MED ORDER — PROPOFOL 500 MG/50ML IV EMUL
INTRAVENOUS | Status: AC
  Filled 2019-09-09: qty 50

## 2019-09-09 SURGICAL SUPPLY — 93 items
APL PRP STRL LF DISP 70% ISPRP (MISCELLANEOUS) ×1
BANDAGE ESMARK 6X9 LF (GAUZE/BANDAGES/DRESSINGS) IMPLANT
BIT DRILL 2.5X2.75 QC CALB (BIT) ×2 IMPLANT
BIT DRILL 2.9 CANN QC NONSTRL (BIT) ×2 IMPLANT
BIT DRILL 3.5X5.5 QC CALB (BIT) ×2 IMPLANT
BLADE SURG 15 STRL LF DISP TIS (BLADE) ×2 IMPLANT
BLADE SURG 15 STRL SS (BLADE) ×6
BNDG CMPR 9X4 STRL LF SNTH (GAUZE/BANDAGES/DRESSINGS)
BNDG CMPR 9X6 STRL LF SNTH (GAUZE/BANDAGES/DRESSINGS)
BNDG COHESIVE 4X5 TAN STRL (GAUZE/BANDAGES/DRESSINGS) ×3 IMPLANT
BNDG COHESIVE 6X5 TAN STRL LF (GAUZE/BANDAGES/DRESSINGS) ×3 IMPLANT
BNDG ESMARK 4X9 LF (GAUZE/BANDAGES/DRESSINGS) IMPLANT
BNDG ESMARK 6X9 LF (GAUZE/BANDAGES/DRESSINGS)
CANISTER SUCT 1200ML W/VALVE (MISCELLANEOUS) ×3 IMPLANT
CHLORAPREP W/TINT 26 (MISCELLANEOUS) ×3 IMPLANT
COVER BACK TABLE 60X90IN (DRAPES) ×3 IMPLANT
COVER WAND RF STERILE (DRAPES) IMPLANT
CUFF TOURN SGL QUICK 34 (TOURNIQUET CUFF) ×3
CUFF TRNQT CYL 34X4.125X (TOURNIQUET CUFF) IMPLANT
DECANTER SPIKE VIAL GLASS SM (MISCELLANEOUS) IMPLANT
DRAPE EXTREMITY T 121X128X90 (DISPOSABLE) ×3 IMPLANT
DRAPE OEC MINIVIEW 54X84 (DRAPES) ×3 IMPLANT
DRAPE U-SHAPE 47X51 STRL (DRAPES) ×3 IMPLANT
DRSG MEPITEL 4X7.2 (GAUZE/BANDAGES/DRESSINGS) ×3 IMPLANT
DRSG PAD ABDOMINAL 8X10 ST (GAUZE/BANDAGES/DRESSINGS) ×8 IMPLANT
ELECT REM PT RETURN 9FT ADLT (ELECTROSURGICAL) ×3
ELECTRODE REM PT RTRN 9FT ADLT (ELECTROSURGICAL) ×1 IMPLANT
GAUZE SPONGE 4X4 12PLY STRL (GAUZE/BANDAGES/DRESSINGS) ×3 IMPLANT
GLOVE BIO SURGEON STRL SZ8 (GLOVE) ×1 IMPLANT
GLOVE BIOGEL PI IND STRL 8 (GLOVE) ×2 IMPLANT
GLOVE BIOGEL PI INDICATOR 8 (GLOVE) ×4
GLOVE ECLIPSE 8.0 STRL XLNG CF (GLOVE) ×1 IMPLANT
GLOVE SURG SS PI 6.5 STRL IVOR (GLOVE) ×4 IMPLANT
GLOVE SURG SS PI 7.0 STRL IVOR (GLOVE) ×4 IMPLANT
GLOVE SURG SYN 7.5  E (GLOVE) ×3
GLOVE SURG SYN 7.5 E (GLOVE) ×1 IMPLANT
GLOVE SURG SYN 7.5 PF PI (GLOVE) IMPLANT
GLOVE SURG SYN 8.0 (GLOVE) ×9 IMPLANT
GLOVE SURG SYN 8.0 PF PI (GLOVE) IMPLANT
GOWN STRL REUS W/ TWL LRG LVL3 (GOWN DISPOSABLE) ×1 IMPLANT
GOWN STRL REUS W/ TWL XL LVL3 (GOWN DISPOSABLE) ×2 IMPLANT
GOWN STRL REUS W/TWL LRG LVL3 (GOWN DISPOSABLE) ×9
GOWN STRL REUS W/TWL XL LVL3 (GOWN DISPOSABLE) ×6
K-WIRE ACE 1.6X6 (WIRE) ×6
KWIRE ACE 1.6X6 (WIRE) IMPLANT
NEEDLE HYPO 22GX1.5 SAFETY (NEEDLE) IMPLANT
NS IRRIG 1000ML POUR BTL (IV SOLUTION) ×3 IMPLANT
PACK BASIN DAY SURGERY FS (CUSTOM PROCEDURE TRAY) ×3 IMPLANT
PAD CAST 4YDX4 CTTN HI CHSV (CAST SUPPLIES) ×1 IMPLANT
PADDING CAST ABS 4INX4YD NS (CAST SUPPLIES) ×2
PADDING CAST ABS COTTON 4X4 ST (CAST SUPPLIES) IMPLANT
PADDING CAST COTTON 4X4 STRL (CAST SUPPLIES) ×3
PADDING CAST COTTON 6X4 STRL (CAST SUPPLIES) ×3 IMPLANT
PENCIL SMOKE EVACUATOR (MISCELLANEOUS) ×3 IMPLANT
PLATE ACE 100DEG 3HOLE (Plate) ×2 IMPLANT
PLATE ACE 100DEG 6HOLE (Plate) ×2 IMPLANT
SANITIZER HAND PURELL 535ML FO (MISCELLANEOUS) ×3 IMPLANT
SCREW ACE CAN 4.0 40M (Screw) ×2 IMPLANT
SCREW ACE CAN 4.0 44M (Screw) ×2 IMPLANT
SCREW ACE CAN 4.0 50M (Screw) ×2 IMPLANT
SCREW CORTICAL 3.5MM  16MM (Screw) ×6 IMPLANT
SCREW CORTICAL 3.5MM  20MM (Screw) ×3 IMPLANT
SCREW CORTICAL 3.5MM  28MM (Screw) ×3 IMPLANT
SCREW CORTICAL 3.5MM  30MM (Screw) ×3 IMPLANT
SCREW CORTICAL 3.5MM 14MM (Screw) ×2 IMPLANT
SCREW CORTICAL 3.5MM 16MM (Screw) IMPLANT
SCREW CORTICAL 3.5MM 18MM (Screw) ×4 IMPLANT
SCREW CORTICAL 3.5MM 20MM (Screw) IMPLANT
SCREW CORTICAL 3.5MM 28MM (Screw) IMPLANT
SCREW CORTICAL 3.5MM 30MM (Screw) IMPLANT
SCREW CORTICAL 3.5MM 48MM (Screw) ×2 IMPLANT
SHEET MEDIUM DRAPE 40X70 STRL (DRAPES) ×3 IMPLANT
SLEEVE SCD COMPRESS KNEE MED (MISCELLANEOUS) ×3 IMPLANT
SPLINT FAST PLASTER 5X30 (CAST SUPPLIES) ×40
SPLINT PLASTER CAST FAST 5X30 (CAST SUPPLIES) ×20 IMPLANT
SPONGE LAP 18X18 RF (DISPOSABLE) ×3 IMPLANT
STOCKINETTE 6  STRL (DRAPES) ×3
STOCKINETTE 6 STRL (DRAPES) ×1 IMPLANT
SUCTION FRAZIER HANDLE 10FR (MISCELLANEOUS) ×3
SUCTION TUBE FRAZIER 10FR DISP (MISCELLANEOUS) ×1 IMPLANT
SUT ETHILON 3 0 PS 1 (SUTURE) ×5 IMPLANT
SUT FIBERWIRE #2 38 T-5 BLUE (SUTURE)
SUT MNCRL AB 3-0 PS2 18 (SUTURE) IMPLANT
SUT VIC AB 0 SH 27 (SUTURE) IMPLANT
SUT VIC AB 2-0 SH 27 (SUTURE) ×6
SUT VIC AB 2-0 SH 27XBRD (SUTURE) ×1 IMPLANT
SUTURE FIBERWR #2 38 T-5 BLUE (SUTURE) IMPLANT
SYR BULB 3OZ (MISCELLANEOUS) ×3 IMPLANT
SYR CONTROL 10ML LL (SYRINGE) IMPLANT
TOWEL GREEN STERILE FF (TOWEL DISPOSABLE) ×6 IMPLANT
TUBE CONNECTING 20'X1/4 (TUBING) ×1
TUBE CONNECTING 20X1/4 (TUBING) ×2 IMPLANT
UNDERPAD 30X36 HEAVY ABSORB (UNDERPADS AND DIAPERS) ×3 IMPLANT

## 2019-09-09 NOTE — H&P (Signed)
Ann Ramos is an 49 y.o. female.   Chief Complaint:  Right ankle injury HPI: The patient is a 49 year old female with a past medical history significant for von Willebrand disease.  She fell and injured her right ankle last week.  She has a trimalleolar fracture and presents today for open treatment with internal fixation.  She is cleared by her hematologist.  Past Medical History:  Diagnosis Date  . Allergy   . Anemia   . Arthritis   . Asthma   . Cancer [facility])    stage one melanoma in June 2018  . GERD (gastroesophageal reflux disease)    takes protonix  . Hypertension   . Pneumonia   . PONV (postoperative nausea and vomiting)   . Von Willebrand disease ([place]) Type 2N    being followed for borderline status     Past Surgical History:  Procedure Laterality Date  . CHOLECYSTECTOMY    . Dilation and Cutterege     polys  . MELANOMA EXCISION     left arm  . PLANTAR FASCIA RELEASE Left 06/26/2017   Procedure: PLANTAR FASCIA RELEASE;  Surgeon: Wylene Simmer, MD;  Location: [facility];  Service: Orthopedics;  Laterality: Left;  . TUBAL LIGATION    . uterine ablation      Family History  Problem Relation Age of Onset  . Cancer Mother        Breast   . Hyperlipidemia Mother   . Diabetes Father   . Hyperlipidemia Father   . Hypertension Father   . Varicose Veins Father    Social History:  reports that she has never smoked. She has never used smokeless tobacco. She reports current alcohol use. She reports that she does not use drugs.  Allergies:  Allergies  Allergen Reactions  . Other Other (See Comments)    DDAVP has caused pt to have itching and burning of her eyes and scalp. Does not tolerate  . Adhesive [Tape] Dermatitis  . Latex Dermatitis    Medications Prior to Admission  Medication Sig Dispense Refill  . albuterol (VENTOLIN HFA) 108 (90 Base) MCG/ACT inhaler Inhale into the lungs every 6 (six) hours as needed for wheezing or shortness of breath.     . cetirizine (ZYRTEC) 10 MG tablet Take 10 mg by mouth daily.    Marland Kitchen ezetimibe (ZETIA) 10 MG tablet Take 10 mg by mouth daily.    . meloxicam (MOBIC) 7.5 MG tablet Take 7.5 mg by mouth in the morning and at bedtime.    . mometasone (NASONEX) 50 MCG/ACT nasal spray Place 2 sprays into the nose daily.     Marland Kitchen olmesartan (BENICAR) 20 MG tablet Take 20 mg by mouth daily.    . pantoprazole (PROTONIX) 40 MG tablet Take 40 mg by mouth daily.    Marland Kitchen HYDROcodone-acetaminophen (NORCO/VICODIN) 5-325 MG tablet Take 1 tablet by mouth every 6 (six) hours as needed for moderate pain.    Marland Kitchen tiZANidine (ZANAFLEX) 4 MG capsule Take 4 mg by mouth 3 (three) times daily as needed for muscle spasms.   6    No results found for this or any previous visit (from the past 48 hour(s)). No results found.  Review of Systems no recent fever, chills, nausea, vomiting or changes in her appetite  Blood pressure 133/87, pulse 82, temperature 99 F (37.2 C), temperature source Oral, resp. rate 20, height 5\' 5"  (1.651 m), weight 86 kg, last menstrual period 08/16/2019, SpO2 100 %. Physical Exam  Well-nourished well-developed woman in no apparent distress.  Alert and oriented x4.  Normal mood and affect.  Extraocular motions are intact.  Respirations are unlabored.  Right ankle is immobilized in a splint.  Brisk capillary refill at the toes.  Intact sensibility to light touch at the forefoot dorsally and plantarly.  5 out of 5 strength in plantarflexion and dorsiflexion of the toes.  Assessment/Plan Right ankle trimalleolar fracture -to the operating room today for open treatment with internal fixation.  The risks and benefits of the alternative treatment options have been discussed in detail.  The patient wishes to proceed with surgery and specifically understands risks of bleeding, infection, nerve damage, blood clots, need for additional surgery, amputation and death.   Wylene Simmer, MD 09/22/19, 8:32 AM

## 2019-09-09 NOTE — Anesthesia Postprocedure Evaluation (Signed)
Anesthesia Post Note  Patient: Ann Ramos  Procedure(s) Performed: Open Reduction Internal Fixation (ORIF) Right Ankle Trimalleolar Fracture (Right Ankle)     Patient location during evaluation: PACU Anesthesia Type: General and Regional Level of consciousness: awake and alert Pain management: pain level controlled Vital Signs Assessment: post-procedure vital signs reviewed and stable Respiratory status: spontaneous breathing, nonlabored ventilation, respiratory function stable and patient connected to nasal cannula oxygen Cardiovascular status: blood pressure returned to baseline and stable Postop Assessment: no apparent nausea or vomiting Anesthetic complications: no    Last Vitals:  Vitals:   09/09/19 1230 09/09/19 1251  BP: 120/73 132/71  Pulse: 89 87  Resp: 16 18  Temp:  37.1 C  SpO2: 99% 99%    Last Pain:  Vitals:   09/09/19 1341  TempSrc:   PainSc: 4                  Brodyn Depuy L Samanyu Tinnell

## 2019-09-09 NOTE — Op Note (Signed)
09/09/2019  11:07 AM  PATIENT:  Ann Ramos  49 y.o. female  PRE-OPERATIVE DIAGNOSIS:  Right ankle trimalleolar fracture  POST-OPERATIVE DIAGNOSIS:  Right ankle trimalleolar fracture  Procedure(s): 1.  Open treatment of right ankle trimalleolar fracture with internal fixation with fixation of the posterior lip 2.  Stress examination of the right ankle under fluoroscopy 3.  AP, mortise and lateral radiographs of the right ankle  SURGEON:  Wylene Simmer, MD  ASSISTANT: Mechele Claude, PA-C  ANESTHESIA:   General, regional  EBL:  minimal   TOURNIQUET: 90 minutes at [id number] mmHg  COMPLICATIONS:  None apparent  DISPOSITION:  Extubated, awake and stable to recovery.  INDICATION FOR PROCEDURE: The patient is a 49 year old female with a past medical history significant for von Willebrand disease.  She fell taking out the trash last week sustaining a trimalleolar fracture of the right ankle.  She presents now for operative treatment of this displaced and unstable right ankle injury.  She is cleared by hematology with adequate clotting factors for surgery.  The risks and benefits of the alternative treatment options have been discussed in detail.  The patient wishes to proceed with surgery and specifically understands risks of bleeding, infection, nerve damage, blood clots, need for additional surgery, amputation and death.  PROCEDURE IN DETAIL:  After pre operative consent was obtained, and the correct operative site was identified, the patient was brought to the operating room and placed supine on the OR table.  Anesthesia was administered.  Pre-operative antibiotics were administered.  A surgical timeout was taken.  The right lower extremity was prepped and draped in standard sterile fashion with a tourniquet around the thigh.  The extremity was elevated and the tourniquet was inflated to 250 mmHg.  A posterior lateral incision was made at the right ankle.  Dissection was carried down to the  subcutaneous tissues.  Care was taken to protect branches of the sural nerve.  The fascia was incised.  The interval between the flexor houses longus and peroneals was developed.  The posterior malleolus fracture site was identified.  The periosteum was incised.  The fracture was accessed through the fibular fracture.  It was cleaned of all hematoma and irrigated copiously.  The posterior malleolus was reduced and provisionally pinned.  A medial incision was then made and dissection was carried down through the subcutaneous tissues.  The interval between the tibia and posterior tibial tendon was developed.  The fracture reduction was confirmed medially.  A 3 hole one third tubular plate was then placed over the apex of the fracture in a buttress position.  It was secured proximally with a fully threaded 3.5 millimeter screw from the [employer] titanium small frag set.  A K wire was then inserted from anterior to posterior across the fracture site at the level of the distal tibial physis.  Radiographs confirmed appropriate reduction of the fracture.  The K wire was overdrilled and a 4 mm partially-threaded cannulated screw was inserted.  This compressed the fracture site appropriately.  The plate was then rotated to the central portion of the fragment and secured distally with another fully threaded 3.5 millimeter screw.  Attention was turned to the lateral malleolus.  The fracture site was cleaned of all hematoma.  The fracture was reduced.  2 tenaculums were used to hold the reduction.  An AP radiograph confirmed appropriate reduction of the fracture.  The medial malleolus that was noted to be mall reduced.  Attention was returned to the medial  side of the ankle.  The medial malleolus fracture was reduced and held with a tenaculum.  Radiographs confirmed appropriate reduction.  The fracture was secured with a 4 mm partially-threaded screw.  It was noted to have excellent compression at the fracture  site.  Attention was then returned to the lateral malleolus.  A 3.5 mm fully threaded lag screw was inserted from posterior to anterior across the fracture site.  It was noted to have excellent purchase and compressed the fracture site appropriately.  A 6 hole one third tubular plate was then contoured to fit the lateral malleolus.  It was secured proximally with 3 bicortical screws and distally with 3 unicortical screws.  Final AP, mortise and lateral radiographs confirmed appropriate reduction of the fractures in appropriate position and length of all hardware.  Stress examination was then performed.  Dorsiflexion and external rotation stress was applied to the supinated forefoot.  There was no widening of the syndesmosis at the medial clear space or distal tib-fib joint.  The wounds were irrigated copiously.  Deep subcutaneous tissues were closed with Vicryl.  Skin incisions were closed with nylon.  Vancomycin powder was placed in all wounds prior to closure.  Sterile dressings were applied followed by well-padded short leg splint.  The tourniquet was released after application of the dressings.  The patient was awakened from anesthesia and transported to the recovery room in stable condition.   FOLLOW UP PLAN: Nonweightbearing on the right lower extremity.  No DVT prophylaxis given the patient's history of von Willebrand disease.  Follow-up in the office in 2 weeks for suture removal and conversion to a short leg cast.  Plan 6 weeks nonweightbearing postoperatively.   RADIOGRAPHS: AP, lateral and mortise radiographs of the right ankle are obtained intraoperatively.  These show interval reduction and fixation of the medial, lateral and posterior malleolar.  Hardware is appropriately positioned and of the appropriate lengths.    Mechele Claude PA-C was present and scrubbed for the duration of the operative case. His assistance was essential in positioning the patient, prepping and draping, gaining and  maintaining exposure, performing the operation, closing and dressing the wounds and applying the splint.

## 2019-09-09 NOTE — Anesthesia Procedure Notes (Signed)
Anesthesia Regional Block: Adductor canal block   Pre-Anesthetic Checklist: ,, timeout performed, Correct Patient, Correct Site, Correct Laterality, Correct Procedure, Correct Position, site marked, Risks and benefits discussed,  Surgical consent,  Pre-op evaluation,  At surgeon's request and post-op pain management  Laterality: Right  Prep: Maximum Sterile Barrier Precautions used, chloraprep       Needles:  Injection technique: Single-shot  Needle Type: Echogenic Stimulator Needle     Needle Length: 9cm  Needle Gauge: 22     Additional Needles:   Procedures:,,,, ultrasound used (permanent image in chart),,,,  Narrative:  Start time: 09/09/2019 8:40 AM End time: 09/09/2019 8:50 AM Injection made incrementally with aspirations every 5 mL.  Performed by: Personally  Anesthesiologist: Freddrick March, MD  Additional Notes: Monitors applied. No increased pain on injection. No increased resistance to injection. Injection made in 5cc increments. Good needle visualization. Patient tolerated procedure well.

## 2019-09-09 NOTE — Addendum Note (Signed)
Addendum  created 09/09/19 1431 by Freddrick March, MD   Clinical Note Signed, Intraprocedure Blocks edited

## 2019-09-09 NOTE — Progress Notes (Signed)
Assisted Dr. Lanetta Inch with right, ultrasound guided, popliteal/saphenous block. Side rails up, monitors on throughout procedure. See vital signs in flow sheet. Tolerated Procedure well.

## 2019-09-09 NOTE — Discharge Instructions (Addendum)
Ann Simmer, MD [facility]  Please read the following information regarding your care after surgery.  Medications  You only need a prescription for the narcotic pain medicine (ex. oxycodone, Percocet, Norco).  All of the other medicines listed below are available over the counter. X Meloxicam as prescribed that you have at home for the first 3 days after surgery. X acetominophen (Tylenol) 650 mg every 4-6 hours as you need for minor to moderate pain. No Tylenol until 2:15 PM on 09/09/2019 X oxycodone as prescribed for severe pain  Narcotic pain medicine (ex. oxycodone, Percocet, Vicodin) will cause constipation.  To prevent this problem, take the following medicines while you are taking any pain medicine. X docusate sodium (Colace) 100 mg twice a day X senna (Senokot) 2 tablets twice a day   Weight Bearing X Do not bear any weight on the operated leg or foot.  Cast / Splint / Dressing X Keep your splint, cast or dressing clean and dry.  Don't put anything (coat hanger, pencil, etc) down inside of it.  If it gets damp, use a hair dryer on the cool setting to dry it.  If it gets soaked, call the office to schedule an appointment for a cast change.     After your dressing, cast or splint is removed; you may shower, but do not soak or scrub the wound.  Allow the water to run over it, and then gently pat it dry.  Swelling It is normal for you to have swelling where you had surgery.  To reduce swelling and pain, keep your toes above your nose for at least 3 days after surgery.  It may be necessary to keep your foot or leg elevated for several weeks.  If it hurts, it should be elevated.  Follow Up Call my office at [phone] when you are discharged from the hospital or surgery center to schedule an appointment to be seen two weeks after surgery.  Call my office at [phone] if you develop a fever >101.5 F, nausea, vomiting, bleeding from the surgical site or severe pain.          Post Anesthesia Home Care Instructions  Activity: Get plenty of rest for the remainder of the day. A responsible individual must stay with you for 24 hours following the procedure.  For the next 24 hours, DO NOT: -Drive a car -Paediatric nurse -Drink alcoholic beverages -Take any medication unless instructed by your physician -Make any legal decisions or sign important papers.  Meals: Start with liquid foods such as gelatin or soup. Progress to regular foods as tolerated. Avoid greasy, spicy, heavy foods. If nausea and/or vomiting occur, drink only clear liquids until the nausea and/or vomiting subsides. Call your physician if vomiting continues.  Special Instructions/Symptoms: Your throat may feel dry or sore from the anesthesia or the breathing tube placed in your throat during surgery. If this causes discomfort, gargle with warm salt water. The discomfort should disappear within 24 hours.  If you had a scopolamine patch placed behind your ear for the management of post- operative nausea and/or vomiting:  1. The medication in the patch is effective for 72 hours, after which it should be removed.  Wrap patch in a tissue and discard in the trash. Wash hands thoroughly with soap and water. 2. You may remove the patch earlier than 72 hours if you experience unpleasant side effects which may include dry mouth, dizziness or visual disturbances. 3. Avoid touching the patch. Wash your hands with soap  and water after contact with the patch.      Call your surgeon if you experience:   1.  Fever over 101.0. 2.  Inability to urinate. 3.  Nausea and/or vomiting. 4.  Extreme swelling or bruising at the surgical site. 5.  Continued bleeding from the incision. 6.  Increased pain, redness or drainage from the incision. 7.  Problems related to your pain medication. 8.  Any problems and/or concerns

## 2019-09-09 NOTE — Anesthesia Preprocedure Evaluation (Addendum)
Anesthesia Evaluation  Patient identified by MRN, date of birth, ID band Patient awake    Reviewed: Allergy & Precautions, NPO status , Patient's Chart, lab work & pertinent test results  History of Anesthesia Complications (+) PONV  Airway Mallampati: II  TM Distance: >3 FB Neck ROM: Full    Dental no notable dental hx. (+) Teeth Intact, Dental Advisory Given   Pulmonary asthma ,    Pulmonary exam normal breath sounds clear to auscultation       Cardiovascular hypertension, Normal cardiovascular exam Rhythm:Regular Rate:Normal  HLD   Neuro/Psych negative neurological ROS  negative psych ROS   GI/Hepatic Neg liver ROS, GERD  Medicated and Controlled,  Endo/Other  negative endocrine ROS  Renal/GU negative Renal ROS  negative genitourinary   Musculoskeletal  (+) Arthritis ,   Abdominal   Peds  Hematology negative hematology ROS (+) Borderline F8 levels, possible VW Type 2, seen by hematologist and cleared for surgery   Anesthesia Other Findings   Reproductive/Obstetrics                           Anesthesia Physical Anesthesia Plan  ASA: II  Anesthesia Plan: General and Regional   Post-op Pain Management:  Regional for Post-op pain   Induction: Intravenous  PONV Risk Score and Plan: 4 or greater and Ondansetron, Dexamethasone, Midazolam and Propofol infusion  Airway Management Planned: LMA  Additional Equipment:   Intra-op Plan:   Post-operative Plan: Extubation in OR  Informed Consent: I have reviewed the patients History and Physical, chart, labs and discussed the procedure including the risks, benefits and alternatives for the proposed anesthesia with the patient or authorized representative who has indicated his/her understanding and acceptance.     Dental advisory given  Plan Discussed with: CRNA  Anesthesia Plan Comments:         Anesthesia Quick Evaluation

## 2019-09-09 NOTE — Transfer of Care (Signed)
Immediate Anesthesia Transfer of Care Note  Patient: Ann Ramos  Procedure(s) Performed: Open Reduction Internal Fixation (ORIF) Right Ankle Trimalleolar Fracture (Right Ankle)  Patient Location: PACU  Anesthesia Type:GA combined with regional for post-op pain  Level of Consciousness: sedated and patient cooperative  Airway & Oxygen Therapy: Patient Spontanous Breathing and Patient connected to face mask oxygen  Post-op Assessment: Report given to RN and Post -op Vital signs reviewed and stable  Post vital signs: Reviewed and stable  Last Vitals:  Vitals Value Taken Time  BP 100/60 09/09/19 1115  Temp    Pulse 97 09/09/19 1115  Resp 19 09/09/19 1115  SpO2 99 % 09/09/19 1115  Vitals shown include unvalidated device data.  Last Pain:  Vitals:   09/09/19 0736  TempSrc: Oral  PainSc: 5       Patients Stated Pain Goal: 5 ([id number])  Complications: No apparent anesthesia complications

## 2019-09-09 NOTE — Anesthesia Procedure Notes (Addendum)
Anesthesia Regional Block: Popliteal block   Pre-Anesthetic Checklist: ,, timeout performed, Correct Patient, Correct Site, Correct Laterality, Correct Procedure, Correct Position, site marked, Risks and benefits discussed,  Surgical consent,  Pre-op evaluation,  At surgeon's request and post-op pain management  Laterality: Right  Prep: Maximum Sterile Barrier Precautions used, chloraprep       Needles:  Injection technique: Single-shot  Needle Type: Echogenic Stimulator Needle     Needle Length: 9cm  Needle Gauge: 22     Additional Needles:   Procedures:,,,, ultrasound used (permanent image in chart),,,,  Narrative:  Start time: 09/09/2019 8:29 AM End time: 09/09/2019 8:39 AM Injection made incrementally with aspirations every 5 mL.  Performed by: Personally  Anesthesiologist: Freddrick March, MD  Additional Notes: Monitors applied. No increased pain on injection. No increased resistance to injection. Injection made in 5cc increments. Good needle visualization. Patient tolerated procedure well.

## 2019-09-09 NOTE — Anesthesia Procedure Notes (Addendum)
Procedure Name: LMA Insertion Date/Time: 09/09/2019 8:59 AM Performed by: Lavonia Dana, CRNA Pre-anesthesia Checklist: Patient identified, Emergency Drugs available, Suction available and Patient being monitored Patient Re-evaluated:Patient Re-evaluated prior to induction Oxygen Delivery Method: Circle system utilized Preoxygenation: Pre-oxygenation with 100% oxygen Induction Type: IV induction Ventilation: Mask ventilation without difficulty LMA: LMA inserted LMA Size: 4.0 Number of attempts: 1 Airway Equipment and Method: Bite block Placement Confirmation: positive ETCO2 Tube secured with: Tape Dental Injury: Teeth and Oropharynx as per pre-operative assessment

## 2019-09-10 ENCOUNTER — Encounter: Payer: Self-pay | Admitting: *Deleted

## 2019-09-29 LAB — VON WILLEBRAND FACTOR MULTIMER

## 2020-04-13 ENCOUNTER — Other Ambulatory Visit: Payer: Self-pay | Admitting: Obstetrics and Gynecology

## 2020-04-13 DIAGNOSIS — Z803 Family history of malignant neoplasm of breast: Secondary | ICD-10-CM

## 2020-05-06 ENCOUNTER — Other Ambulatory Visit: Payer: Commercial Managed Care - PPO

## 2021-06-07 ENCOUNTER — Other Ambulatory Visit: Payer: Self-pay | Admitting: Obstetrics and Gynecology

## 2021-06-07 DIAGNOSIS — Z803 Family history of malignant neoplasm of breast: Secondary | ICD-10-CM

## 2021-06-11 ENCOUNTER — Other Ambulatory Visit: Payer: Commercial Managed Care - PPO

## 2021-07-03 ENCOUNTER — Other Ambulatory Visit: Payer: Self-pay | Admitting: Obstetrics and Gynecology

## 2021-07-03 DIAGNOSIS — Z803 Family history of malignant neoplasm of breast: Secondary | ICD-10-CM

## 2021-07-17 ENCOUNTER — Other Ambulatory Visit: Payer: Commercial Managed Care - PPO

## 2021-07-28 ENCOUNTER — Other Ambulatory Visit: Payer: Commercial Managed Care - PPO

## 2021-08-11 ENCOUNTER — Other Ambulatory Visit: Payer: Commercial Managed Care - PPO

## 2021-10-17 ENCOUNTER — Ambulatory Visit
Admission: RE | Admit: 2021-10-17 | Discharge: 2021-10-17 | Disposition: A | Discharge status: Home / Self Care | Payer: Managed Care, Other (non HMO) | Source: Ambulatory Visit | Attending: Obstetrics and Gynecology | Admitting: Obstetrics and Gynecology

## 2021-10-17 DIAGNOSIS — Z803 Family history of malignant neoplasm of breast: Secondary | ICD-10-CM

## 2021-10-17 MED ORDER — GADOBUTROL 1 MMOL/ML IV SOLN
9.0000 mL | Freq: Once | INTRAVENOUS | Status: AC | PRN
  Administered 2021-10-17: 9 mL via INTRAVENOUS

## 2022-04-29 ENCOUNTER — Encounter: Payer: Self-pay | Admitting: Gastroenterology

## 2022-06-21 ENCOUNTER — Encounter: Payer: Self-pay | Admitting: Gastroenterology

## 2022-06-21 ENCOUNTER — Ambulatory Visit: Payer: Managed Care, Other (non HMO) | Admitting: Gastroenterology

## 2022-06-21 VITALS — BP 132/66 | HR 81 | Ht 65.0 in | Wt 187.4 lb

## 2022-06-21 DIAGNOSIS — R7989 Other specified abnormal findings of blood chemistry: Secondary | ICD-10-CM

## 2022-06-21 DIAGNOSIS — Z1212 Encounter for screening for malignant neoplasm of rectum: Secondary | ICD-10-CM

## 2022-06-21 DIAGNOSIS — K581 Irritable bowel syndrome with constipation: Secondary | ICD-10-CM | POA: Diagnosis not present

## 2022-06-21 DIAGNOSIS — Z1211 Encounter for screening for malignant neoplasm of colon: Secondary | ICD-10-CM | POA: Diagnosis not present

## 2022-06-21 DIAGNOSIS — K219 Gastro-esophageal reflux disease without esophagitis: Secondary | ICD-10-CM

## 2022-06-21 MED ORDER — CLENPIQ 10-3.5-12 MG-GM -GM/175ML PO SOLN
1.0000 | ORAL | 0 refills | Status: DC
Start: 2022-06-21 — End: 2022-08-19

## 2022-06-21 NOTE — Progress Notes (Signed)
Chief Complaint: For GI evaluation  Referring Provider:  Myrlene Broker, MD      ASSESSMENT AND PLAN;   #1. CRC screening (dad had polyps at age 50)  #2. IBS-C  #3. Longstading GERD  #3. Abn ALT - likely d/t fatty liver.    Plan: -EGD/colon with 2 day prep -Mg supplements -Miralax 17 g po QD -[place] abdo complete -Start walking and reduce wt. Aim to reduce 10 lb over next 3 months   I discussed EGD/Colonoscopy- the indications, risks, alternatives and potential complications including, but not limited to bleeding, infection, reaction to meds, damage to internal organs, cardiac and/or pulmonary problems, and perforation requiring surgery. The possibility that significant findings could be missed was explained. All ? were answered. Pt consents to proceed.   HPI:    Ann Ramos is a 52 y.o. female  With asthma, arthritis, H/O stage I melanoma, borderline von Willebrand's, interstitial cystitis, mild obesity  Here for screening colonoscopy and possibly EGD.  Longstanding H/O constipation x since teenage years.  Would have diarrhea during menstrual cycles.  Associated abdominal bloating, LUQ abdominal discomfort which gets better with BMs.  Currently having 2-3 BMs per week.  Had x-ray KUB which showed significant right-sided constipation April 2023.  Has been on magnesium supplements with some relief.  Has not tried any medications for IBS.  Would occasionally have diarrhea.  No melena or hematochezia.  Never had colonoscopy performed.    Also with longstanding GERD x 30 yrs even during first pregnancy, S/P lap chole 1994 (after biliary pancreatitis), taken multiple medications including Zantac, Pepcid and omeprazole without any relief.  She has been on Protonix for 5 to 6 years.  Stopped as she felt it was causing more gas and bloating.  No sodas, chocolates, chewing gums, artificial sweeteners and candy. No NSAIDs. No ETOH.  [facility]en at [facility] and had extensive  labs from 03/27/2022 -Normal CBC with hemoglobin 13.9, MCV 89, platelets 244 K -Normal CMP except ALT 38.  Remaining LFTs were normal. -Normal iron studies, hemoglobin A1c, TSH, DHEA, FSH, estradiol, testosterone, vitamin D, B12, lipase, amylase, serum copper, progesterone, ferritin, magnesium  Wt Readings from Last 3 Encounters:  06/21/22 187 lb 6 oz (85 kg)  09/09/19 189 lb 9.5 oz (86 kg)  09/06/19 185 lb (83.9 kg)      SH-married, 1 son (age 67, welder) and 1 daughter (high school, Paramedic)  FH-dad had polyps at age 9  Past Medical History:  Diagnosis Date   Allergy    Anemia    Arthritis    Asthma    Cancer ([place])    stage one melanoma in June 2018   Gallstones    GERD (gastroesophageal reflux disease)    takes protonix   Hypertension    Interstitial cystitis    Obesity    Pancreatitis    Pneumonia    PONV (postoperative nausea and vomiting)    Von Willebrand disease ([place]) Type 2N    being followed for borderline status     Past Surgical History:  Procedure Laterality Date   CHOLECYSTECTOMY     Dilation and Cutterege     polys   MELANOMA EXCISION     left arm   ORIF ANKLE FRACTURE Right 09/09/2019   Procedure: Open Reduction Internal Fixation (ORIF) Right Ankle Trimalleolar Fracture;  Surgeon: Wylene Simmer, MD;  Location: [place];  Service: Orthopedics;  Laterality: Right;   PLANTAR FASCIA RELEASE Left 06/26/2017  Procedure: PLANTAR FASCIA RELEASE;  Surgeon: Wylene Simmer, MD;  Location: [place];  Service: Orthopedics;  Laterality: Left;   TUBAL LIGATION     uterine ablation      Family History  Problem Relation Age of Onset   Cancer Mother        Breast 2x   Hyperlipidemia Mother    Diabetes Father    Hyperlipidemia Father    Hypertension Father    Varicose Veins Father    Colon polyps Father    Bladder Cancer Father    Pancreatic cancer Maternal Grandfather    Breast cancer Maternal Aunt     Social History    Tobacco Use   Smoking status: Never   Smokeless tobacco: Never  Vaping Use   Vaping Use: Never used  Substance Use Topics   Alcohol use: Yes    Comment: Occasional alcohol   Drug use: No    Current Outpatient Medications  Medication Sig Dispense Refill   albuterol (VENTOLIN HFA) 108 (90 Base) MCG/ACT inhaler Inhale into the lungs every 6 (six) hours as needed for wheezing or shortness of breath.     cholecalciferol (VITAMIN D3) 25 MCG (1000 UNIT) tablet Take 1,000 Units by mouth daily.     DIGESTIVE ENZYMES PO Take 1 mg by mouth daily.     ezetimibe (ZETIA) 10 MG tablet Take 10 mg by mouth daily.     famotidine (PEPCID) 10 MG tablet Take 10 mg by mouth 2 (two) times daily.     levocetirizine (XYZAL) 2.5 MG/5ML solution Take 2.5 mg by mouth every evening.     mometasone (NASONEX) 50 MCG/ACT nasal spray Place 2 sprays into the nose daily.      olmesartan (BENICAR) 20 MG tablet Take 20 mg by mouth daily.     progesterone (PROMETRIUM) 100 MG capsule Take 100 mg by mouth daily.     tiZANidine (ZANAFLEX) 4 MG capsule Take 4 mg by mouth 3 (three) times daily as needed for muscle spasms.   6   cetirizine (ZYRTEC) 10 MG tablet Take 10 mg by mouth daily.     docusate sodium (COLACE) 100 MG capsule Take 1 capsule (100 mg total) by mouth 2 (two) times daily. While taking narcotic pain medicine. 30 capsule 0   meloxicam (MOBIC) 7.5 MG tablet Take 7.5 mg by mouth in the morning and at bedtime.     pantoprazole (PROTONIX) 40 MG tablet Take 40 mg by mouth daily.     senna (SENOKOT) 8.6 MG TABS tablet Take 2 tablets (17.2 mg total) by mouth 2 (two) times daily. 30 tablet 0   No current facility-administered medications for this visit.    Allergies  Allergen Reactions   Other Other (See Comments)    DDAVP has caused pt to have itching and burning of her eyes and scalp. Does not tolerate   Adhesive [Tape] Dermatitis   Latex Dermatitis    Review of Systems:  Constitutional: Denies fever,  chills, diaphoresis, appetite change and fatigue.  HEENT: Denies photophobia, eye pain, redness, hearing loss, ear pain, congestion, sore throat, rhinorrhea, sneezing, mouth sores, neck pain, neck stiffness and tinnitus.   Respiratory: Denies SOB, DOE, cough, chest tightness,  and wheezing.   Cardiovascular: Denies chest pain, palpitations and leg swelling.  Genitourinary: Denies dysuria, urgency, frequency, hematuria, flank pain and difficulty urinating.  Musculoskeletal: Denies myalgias, back pain, joint swelling, arthralgias and gait problem.  Skin: No rash.  Neurological: Denies dizziness, seizures, syncope, weakness, light-headedness,  numbness and headaches.  Hematological: Denies adenopathy. Easy bruising, personal or family bleeding history  Psychiatric/Behavioral: No anxiety or depression     Physical Exam:    BP 132/66   Pulse 81   Ht '5\' 5"'$  (1.651 m)   Wt 187 lb 6 oz (85 kg)   BMI 31.18 kg/m  Wt Readings from Last 3 Encounters:  06/21/22 187 lb 6 oz (85 kg)  09/09/19 189 lb 9.5 oz (86 kg)  09/06/19 185 lb (83.9 kg)   Constitutional:  Well-developed, in no acute distress. Psychiatric: Normal mood and affect. Behavior is normal. HEENT: Pupils normal.  Conjunctivae are normal. No scleral icterus. Cardiovascular: Normal rate, regular rhythm. No edema Pulmonary/chest: Effort normal and breath sounds normal. No wheezing, rales or rhonchi. Abdominal: Soft, nondistended. Nontender. Bowel sounds active throughout. There are no masses palpable. No hepatomegaly. Rectal: Deferred Neurological: Alert and oriented to person place and time. Skin: Skin is warm and dry. No rashes noted.  Data Reviewed: I have personally reviewed following labs and imaging studies  CBC:    Latest Ref Rng & Units 09/06/2019    9:09 AM 08/24/2019    3:00 PM 06/27/2016    2:31 PM  CBC  WBC 4.0 - 10.5 K/uL 7.4  9.2  7.8   Hemoglobin 12.0 - 15.0 g/dL 13.7  14.0  14.2   Hematocrit 36.0 - 46.0 % 41.5  42.9   42.7   Platelets 150 - 400 K/uL 218  234  221     CMP:    Latest Ref Rng & Units 09/06/2019    9:09 AM 08/24/2019    3:00 PM 06/27/2016    2:31 PM  CMP  Glucose 70 - 99 mg/dL 110  115  91   BUN 6 - 20 mg/dL '9  11  13   '$ Creatinine 0.44 - 1.00 mg/dL 0.68  0.93  0.62   Sodium 135 - 145 mmol/L 142  141  140   Potassium 3.5 - 5.1 mmol/L 3.9  3.4  4.1   Chloride 98 - 111 mmol/L 110  109  105   CO2 22 - 32 mmol/L '23  23  22   '$ Calcium 8.9 - 10.3 mg/dL 9.1  9.3  9.2   Total Protein 6.5 - 8.1 g/dL 6.4   6.7   Total Bilirubin 0.3 - 1.2 mg/dL 0.7   0.4   Alkaline Phos 38 - 126 U/L 65   45   AST 15 - 41 U/L 28   14   ALT 0 - 44 U/L 63   17        Carmell Austria, MD 06/21/2022, 2:36 PM  Cc: Myrlene Broker, MD

## 2022-06-21 NOTE — Patient Instructions (Addendum)
_______________________________________________________  If you are age 51 or older, your body mass index should be between 23-30. Your Body mass index is 31.18 kg/m. If this is out of the aforementioned range listed, please consider follow up with your Primary Care Provider.  If you are age 54 or younger, your body mass index should be between 19-25. Your Body mass index is 31.18 kg/m. If this is out of the aformentioned range listed, please consider follow up with your Primary Care Provider.   ________________________________________________________  The [facility] providers would like to encourage you to use [facility] to communicate with providers for non-urgent requests or questions.  Due to long hold times on the telephone, sending your provider a message by [facility] may be a faster and more efficient way to get a response.  Please allow 48 business hours for a response.  Please remember that this is for non-urgent requests.  _______________________________________________________  Dennis Bast have been scheduled for an endoscopy and colonoscopy. Please follow the written instructions given to you at your visit today. Please pick up your prep supplies at the pharmacy within the next 1-3 days. If you use inhalers (even only as needed), please bring them with you on the day of your procedure.  Two days before your procedure: Mix 3 packs (or capfuls) of Miralax in 48 ounces of clear liquid and drink at 6pm.  We have sent the following medications to your pharmacy for you to pick up at your convenience: Clenpiq   Please purchase the following medications over the counter and take as directed: Miralax 17g daily Double up on your magnesium  You have been scheduled for an abdominal ultrasound at [facility] (1st floor of hospital) on 06-27-2022 at 930am . Please arrive 30 minutes prior to your appointment for registration. Make certain not to have anything to eat or drink midnight prior to your  appointment. Should you need to reschedule your appointment, please contact radiology at [phone]. This test typically takes about 30 minutes to perform.  Please call with any questions or concerns.  Thank you,  Dr. Jackquline Denmark

## 2022-06-27 ENCOUNTER — Ambulatory Visit (HOSPITAL_COMMUNITY)
Admission: RE | Admit: 2022-06-27 | Discharge: 2022-06-27 | Disposition: A | Discharge status: Home / Self Care | Payer: Managed Care, Other (non HMO) | Source: Ambulatory Visit | Attending: Gastroenterology | Admitting: Gastroenterology

## 2022-06-27 DIAGNOSIS — K581 Irritable bowel syndrome with constipation: Secondary | ICD-10-CM | POA: Diagnosis present

## 2022-06-27 DIAGNOSIS — R7989 Other specified abnormal findings of blood chemistry: Secondary | ICD-10-CM | POA: Diagnosis present

## 2022-07-18 ENCOUNTER — Other Ambulatory Visit: Payer: Self-pay | Admitting: Obstetrics and Gynecology

## 2022-07-18 DIAGNOSIS — Z1231 Encounter for screening mammogram for malignant neoplasm of breast: Secondary | ICD-10-CM

## 2022-08-12 ENCOUNTER — Telehealth: Payer: Self-pay | Admitting: Gastroenterology

## 2022-08-12 NOTE — Telephone Encounter (Signed)
PT is calling to have prep instructions resent to MyChart. Please advise. Thank you

## 2022-08-12 NOTE — Telephone Encounter (Signed)
Pt stated that she found the prep letter that she was looking for and no longer needs [place] to send her anything.  Pt verbalized understanding with all questions answered.

## 2022-08-19 ENCOUNTER — Encounter: Payer: Self-pay | Admitting: Gastroenterology

## 2022-08-19 ENCOUNTER — Ambulatory Visit: Payer: Managed Care, Other (non HMO) | Admitting: Gastroenterology

## 2022-08-19 VITALS — BP 118/74 | HR 76 | Temp 96.6°F | Resp 13 | Ht 65.0 in | Wt 187.0 lb

## 2022-08-19 DIAGNOSIS — K259 Gastric ulcer, unspecified as acute or chronic, without hemorrhage or perforation: Secondary | ICD-10-CM | POA: Diagnosis not present

## 2022-08-19 DIAGNOSIS — Z1211 Encounter for screening for malignant neoplasm of colon: Secondary | ICD-10-CM | POA: Diagnosis present

## 2022-08-19 DIAGNOSIS — K635 Polyp of colon: Secondary | ICD-10-CM

## 2022-08-19 DIAGNOSIS — K2289 Other specified disease of esophagus: Secondary | ICD-10-CM

## 2022-08-19 DIAGNOSIS — K219 Gastro-esophageal reflux disease without esophagitis: Secondary | ICD-10-CM

## 2022-08-19 DIAGNOSIS — D127 Benign neoplasm of rectosigmoid junction: Secondary | ICD-10-CM | POA: Diagnosis not present

## 2022-08-19 MED ORDER — OMEPRAZOLE 20 MG PO CPDR: 20.0000 mg | DELAYED_RELEASE_CAPSULE | Freq: Every day | ORAL | 6 refills | Status: DC

## 2022-08-19 MED ORDER — SODIUM CHLORIDE 0.9 % IV SOLN: 500.0000 mL | Freq: Once | INTRAVENOUS | Status: AC

## 2022-08-19 NOTE — Progress Notes (Signed)
Called to room to assist during endoscopic procedure.  Patient ID and intended procedure confirmed with present staff. Received instructions for my participation in the procedure from the performing physician.  

## 2022-08-19 NOTE — Patient Instructions (Signed)
YOU HAD AN ENDOSCOPIC PROCEDURE TODAY AT [facility]:   Refer to the procedure report that was given to you for any specific questions about what was found during the examination.  If the procedure report does not answer your questions, please call your gastroenterologist to clarify.  If you requested that your care partner not be given the details of your procedure findings, then the procedure report has been included in a sealed envelope for you to review at your convenience later.  YOU SHOULD EXPECT: Some feelings of bloating in the abdomen. Passage of more gas than usual.  Walking can help get rid of the air that was put into your GI tract during the procedure and reduce the bloating. If you had a lower endoscopy (such as a colonoscopy or flexible sigmoidoscopy) you may notice spotting of blood in your stool or on the toilet paper. If you underwent a bowel prep for your procedure, you may not have a normal bowel movement for a few days.  Please Note:  You might notice some irritation and congestion in your nose or some drainage.  This is from the oxygen used during your procedure.  There is no need for concern and it should clear up in a day or so.  SYMPTOMS TO REPORT IMMEDIATELY:  Following lower endoscopy (colonoscopy or flexible sigmoidoscopy):  Excessive amounts of blood in the stool  Significant tenderness or worsening of abdominal pains  Swelling of the abdomen that is new, acute  Fever of 100F or higher  Following upper endoscopy (EGD)  Vomiting of blood or coffee ground material  New chest pain or pain under the shoulder blades  Painful or persistently difficult swallowing  New shortness of breath  Fever of 100F or higher  Black, tarry-looking stools  For urgent or emergent issues, a gastroenterologist can be reached at any hour by calling [phone]. Do not use MyChart messaging for urgent concerns.    DIET:  We do recommend a small meal at first, but  then you may proceed to your regular diet.  Drink plenty of fluids but you should avoid alcoholic beverages for 24 hours.  MEDICATIONS: Continue present medications. Use Prilosec (Omeprazole) 20 mg by mouth daily (#30 with 6 refills RX sent to your pharmacy).Take it half an hour (30 minutes) before breakfast. Can take Pepcid 20 mg by mouth nightly. AVOID Ibuprofen, naproxen, or other non-steroidal anti-inflammatory drugs.  FOLLOW ANTI-REFLUX PRECAUTIONS for non-pharmacologic means of reflux control.  FOLLOW UP: Await pathology results. Repeat colonoscopy for surveillance based on pathology results.  Thank you for allowing [place] to provide for your healthcare needs today.    ACTIVITY:  You should plan to take it easy for the rest of today and you should NOT DRIVE or use heavy machinery until tomorrow (because of the sedation medicines used during the test).    FOLLOW UP: Our staff will call the number listed on your records the next business day following your procedure.  We will call around 7:15- 8:00 am to check on you and address any questions or concerns that you may have regarding the information given to you following your procedure. If we do not reach you, we will leave a message.     If any biopsies were taken you will be contacted by phone or by letter within the next 1-3 weeks.  Please call us at [phone] if you have not heard about the biopsies in 3 weeks.    SIGNATURES/CONFIDENTIALITY: You and/or  your care partner have signed paperwork which will be entered into your electronic medical record.  These signatures attest to the fact that that the information above on your After Visit Summary has been reviewed and is understood.  Full responsibility of the confidentiality of this discharge information lies with you and/or your care-partner.

## 2022-08-19 NOTE — Op Note (Signed)
[place] Patient Name: Ann Ramos Procedure Date: 08/19/2022 8:38 AM MRN: [id number] Endoscopist: Jackquline Denmark , MD, [id number] Age: 52 Referring MD:  Date of Birth: 03/23/1971 Gender: Female Account #: [id number] Procedure:                Colonoscopy Indications:              Screening for colorectal malignant neoplasm, Colon                            cancer screening in patient at increased risk: FH                            polyps -dad at age 62. Medicines:                Monitored Anesthesia Care Procedure:                Pre-Anesthesia Assessment:                           - Prior to the procedure, a History and Physical                            was performed, and patient medications and                            allergies were reviewed. The patient's tolerance of                            previous anesthesia was also reviewed. The risks                            and benefits of the procedure and the sedation                            options and risks were discussed with the patient.                            All questions were answered, and informed consent                            was obtained. Prior Anticoagulants: The patient has                            taken no anticoagulant or antiplatelet agents. ASA                            Grade Assessment: II - A patient with mild systemic                            disease. After reviewing the risks and benefits,                            the patient was deemed in satisfactory condition to  undergo the procedure.                           After obtaining informed consent, the colonoscope                            was passed under direct vision. Throughout the                            procedure, the patient's blood pressure, pulse, and                            oxygen saturations were monitored continuously. The                            Olympus [id number]  Colonoscope was                            introduced through the anus and advanced to the 2                            cm into the ileum. The colonoscopy was performed                            without difficulty. The patient tolerated the                            procedure well. The quality of the bowel                            preparation was good. The terminal ileum, ileocecal                            valve, appendiceal orifice, and rectum were                            photographed. Scope In: 8:53:58 AM Scope Out: 9:07:34 AM Scope Withdrawal Time: 0 hours 11 minutes 17 seconds  Total Procedure Duration: 0 hours 13 minutes 36 seconds  Findings:                 Two sessile polyps were found in the recto-sigmoid                            colon. The polyps were 4 to 6 mm in size. These                            polyps were removed with a cold snare. Resection                            and retrieval were complete.                           A few rare small-mouthed diverticula were found in  the sigmoid colon and ascending colon.                           The terminal ileum appeared normal.                           Non-bleeding internal hemorrhoids were found during                            retroflexion. The hemorrhoids were small and Grade                            I (internal hemorrhoids that do not prolapse).                           The exam was otherwise without abnormality on                            direct and retroflexion views. Complications:            No immediate complications. Estimated Blood Loss:     Estimated blood loss: none. Impression:               - Two 4 to 6 mm polyps at the recto-sigmoid colon,                            removed with a cold snare. Resected and retrieved.                           - Mild colonic diverticulosis                           - The examined portion of the ileum was normal.                            - Non-bleeding internal hemorrhoids.                           - The examination was otherwise normal on direct                            and retroflexion views. Recommendation:           - Patient has a contact number available for                            emergencies. The signs and symptoms of potential                            delayed complications were discussed with the                            patient. Return to normal activities tomorrow.                            Written discharge instructions were provided to  the                            patient.                           - Resume previous diet.                           - Continue present medications.                           - Await pathology results.                           - Repeat colonoscopy for surveillance based on                            pathology results.                           - The findings and recommendations were discussed                            with the patient's family. Jackquline Denmark, MD 08/19/2022 9:14:23 AM This report has been signed electronically.

## 2022-08-19 NOTE — Op Note (Signed)
[facility] Patient Name: Ann Ramos Procedure Date: 08/19/2022 8:38 AM MRN: [id number] Endoscopist: Jackquline Denmark , MD, [id number] Age: 52 Referring MD:  Date of Birth: 1971-05-14 Gender: Female Account #: [id number] Procedure:                Upper GI endoscopy Indications:              Longstanding GERD. Medicines:                Monitored Anesthesia Care Procedure:                Pre-Anesthesia Assessment:                           - Prior to the procedure, a History and Physical                            was performed, and patient medications and                            allergies were reviewed. The patient's tolerance of                            previous anesthesia was also reviewed. The risks                            and benefits of the procedure and the sedation                            options and risks were discussed with the patient.                            All questions were answered, and informed consent                            was obtained. Prior Anticoagulants: The patient has                            taken no anticoagulant or antiplatelet agents. ASA                            Grade Assessment: II - A patient with mild systemic                            disease. After reviewing the risks and benefits,                            the patient was deemed in satisfactory condition to                            undergo the procedure.                           After obtaining informed consent, the endoscope was  passed under direct vision. Throughout the                            procedure, the patient's blood pressure, pulse, and                            oxygen saturations were monitored continuously. The                            Olympus scope [id number] was introduced through the                            mouth, and advanced to the second part of duodenum.                            The upper GI endoscopy was  accomplished without                            difficulty. The patient tolerated the procedure                            well. Scope In: Scope Out: Findings:                 The examined esophagus was normal. Biopsies were                            obtained from the proximal and distal esophagus                            with cold forceps for histology to r/o eosinophilic                            esophagitis. Z-line well-defined at 36 cm                           A small hiatal hernia was present. Confirmed on                            retroflexed examination of the cardia.                           One non-bleeding cratered gastric ulcer with no                            stigmata of bleeding was found in the gastric                            antrum. The lesion was 6 mm in largest dimension.                            Biopsies were taken with a cold forceps for  histology.                           The examined duodenum was normal. Complications:            No immediate complications. Estimated Blood Loss:     Estimated blood loss: none. Impression:               - Small hiatal hernia.                           - Non-bleeding gastric ulcer with no stigmata of                            bleeding. Biopsied. Recommendation:           - Patient has a contact number available for                            emergencies. The signs and symptoms of potential                            delayed complications were discussed with the                            patient. Return to normal activities tomorrow.                            Written discharge instructions were provided to the                            patient.                           - Resume previous diet.                           - Use Prilosec (omeprazole) 20 mg PO daily- #30,                            6RF. Take it half an hour before breakfast. Can                            take Pepcid 20 mg p.o.  nightly                           - Avoid ibuprofen, naproxen, or other non-steroidal                            anti-inflammatory drugs.                           - Await pathology results.                           - Nonpharmacologic means of reflux control.                           -  The findings and recommendations were discussed                            with the patient's family. Jackquline Denmark, MD 08/19/2022 9:10:44 AM This report has been signed electronically.

## 2022-08-19 NOTE — Progress Notes (Signed)
Chief Complaint: For GI evaluation  Referring Provider:  Myrlene Broker, MD      ASSESSMENT AND PLAN;   #1. CRC screening (dad had polyps at age 51)  #2. IBS-C  #3. Longstading GERD  #3. Abn ALT - likely d/t fatty liver.    Plan: -EGD/colon with 2 day prep -Mg supplements -Miralax 17 g po QD -[place] abdo complete -Start walking and reduce wt. Aim to reduce 10 lb over next 3 months   I discussed EGD/Colonoscopy- the indications, risks, alternatives and potential complications including, but not limited to bleeding, infection, reaction to meds, damage to internal organs, cardiac and/or pulmonary problems, and perforation requiring surgery. The possibility that significant findings could be missed was explained. All ? were answered. Pt consents to proceed.   HPI:    Ann Ramos is a 52 y.o. female  With asthma, arthritis, H/O stage I melanoma, borderline von Willebrand's, interstitial cystitis, mild obesity  Here for screening colonoscopy and possibly EGD.  Longstanding H/O constipation x since teenage years.  Would have diarrhea during menstrual cycles.  Associated abdominal bloating, LUQ abdominal discomfort which gets better with BMs.  Currently having 2-3 BMs per week.  Had x-ray KUB which showed significant right-sided constipation April 2023.  Has been on magnesium supplements with some relief.  Has not tried any medications for IBS.  Would occasionally have diarrhea.  No melena or hematochezia.  Never had colonoscopy performed.    Also with longstanding GERD x 30 yrs even during first pregnancy, S/P lap chole 1994 (after biliary pancreatitis), taken multiple medications including Zantac, Pepcid and omeprazole without any relief.  She has been on Protonix for 5 to 6 years.  Stopped as she felt it was causing more gas and bloating.  No sodas, chocolates, chewing gums, artificial sweeteners and candy. No NSAIDs. No ETOH.  [facility] at [facility] and had extensive  labs from 03/27/2022 -Normal CBC with hemoglobin 13.9, MCV 89, platelets 244 K -Normal CMP except ALT 38.  Remaining LFTs were normal. -Normal iron studies, hemoglobin A1c, TSH, DHEA, FSH, estradiol, testosterone, vitamin D, B12, lipase, amylase, serum copper, progesterone, ferritin, magnesium  Wt Readings from Last 3 Encounters:  08/19/22 187 lb (84.8 kg)  06/21/22 187 lb 6 oz (85 kg)  09/09/19 189 lb 9.5 oz (86 kg)      SH-married, 1 son (age 71, welder) and 1 daughter (high school, Paramedic)  FH-dad had polyps at age 83  Past Medical History:  Diagnosis Date   Allergy    Anemia    Arthritis    Asthma    Cancer ([place])    stage one melanoma in June 2018   Gallstones    GERD (gastroesophageal reflux disease)    takes protonix   Hypertension    Interstitial cystitis    Obesity    Pancreatitis    Pneumonia    PONV (postoperative nausea and vomiting)    Von Willebrand disease ([place]) Type 2N    being followed for borderline status     Past Surgical History:  Procedure Laterality Date   CHOLECYSTECTOMY     Dilation and Cutterege     polys   MELANOMA EXCISION     left arm   ORIF ANKLE FRACTURE Right 09/09/2019   Procedure: Open Reduction Internal Fixation (ORIF) Right Ankle Trimalleolar Fracture;  Surgeon: Wylene Simmer, MD;  Location: [facility];  Service: Orthopedics;  Laterality: Right;   PLANTAR FASCIA RELEASE Left 06/26/2017  Procedure: PLANTAR FASCIA RELEASE;  Surgeon: Wylene Simmer, MD;  Location: [facility];  Service: Orthopedics;  Laterality: Left;   TUBAL LIGATION     uterine ablation      Family History  Problem Relation Age of Onset   Cancer Mother        Breast 2x   Hyperlipidemia Mother    Diabetes Father    Hyperlipidemia Father    Hypertension Father    Varicose Veins Father    Colon polyps Father    Bladder Cancer Father    Pancreatic cancer Maternal Grandfather    Breast cancer Maternal Aunt     Social History    Tobacco Use   Smoking status: Never   Smokeless tobacco: Never  Vaping Use   Vaping Use: Never used  Substance Use Topics   Alcohol use: Yes    Comment: Occasional alcohol   Drug use: No    Current Outpatient Medications  Medication Sig Dispense Refill   cholecalciferol (VITAMIN D3) 25 MCG (1000 UNIT) tablet Take 1,000 Units by mouth daily.     DIGESTIVE ENZYMES PO Take 1 mg by mouth daily.     levocetirizine (XYZAL) 2.5 MG/5ML solution Take 2.5 mg by mouth every evening.     mometasone (NASONEX) 50 MCG/ACT nasal spray Place 2 sprays into the nose daily.      olmesartan (BENICAR) 20 MG tablet Take 20 mg by mouth daily.     albuterol (VENTOLIN HFA) 108 (90 Base) MCG/ACT inhaler Inhale into the lungs every 6 (six) hours as needed for wheezing or shortness of breath.     famotidine (PEPCID) 10 MG tablet Take 10 mg by mouth 2 (two) times daily. (Patient not taking: Reported on 08/19/2022)     progesterone (PROMETRIUM) 100 MG capsule Take 100 mg by mouth daily.     tiZANidine (ZANAFLEX) 4 MG capsule Take 4 mg by mouth 3 (three) times daily as needed for muscle spasms.   6   Current Facility-Administered Medications  Medication Dose Route Frequency Provider Last Rate Last Admin   0.9 %  sodium chloride infusion  500 mL Intravenous Once Jackquline Denmark, MD        Allergies  Allergen Reactions   Other Other (See Comments)    DDAVP has caused pt to have itching and burning of her eyes and scalp. Does not tolerate   Adhesive [Tape] Dermatitis   Latex Dermatitis    Review of Systems:  Constitutional: Denies fever, chills, diaphoresis, appetite change and fatigue.  HEENT: Denies photophobia, eye pain, redness, hearing loss, ear pain, congestion, sore throat, rhinorrhea, sneezing, mouth sores, neck pain, neck stiffness and tinnitus.   Respiratory: Denies SOB, DOE, cough, chest tightness,  and wheezing.   Cardiovascular: Denies chest pain, palpitations and leg swelling.  Genitourinary:  Denies dysuria, urgency, frequency, hematuria, flank pain and difficulty urinating.  Musculoskeletal: Denies myalgias, back pain, joint swelling, arthralgias and gait problem.  Skin: No rash.  Neurological: Denies dizziness, seizures, syncope, weakness, light-headedness, numbness and headaches.  Hematological: Denies adenopathy. Easy bruising, personal or family bleeding history  Psychiatric/Behavioral: No anxiety or depression     Physical Exam:    BP 113/71   Pulse 72   Temp (!) 96.6 F (35.9 C)   Ht '5\' 5"'$  (1.651 m)   Wt 187 lb (84.8 kg)   SpO2 100%   BMI 31.12 kg/m  Wt Readings from Last 3 Encounters:  08/19/22 187 lb (84.8 kg)  06/21/22 187 lb 6  oz (85 kg)  09/09/19 189 lb 9.5 oz (86 kg)   Constitutional:  Well-developed, in no acute distress. Psychiatric: Normal mood and affect. Behavior is normal. HEENT: Pupils normal.  Conjunctivae are normal. No scleral icterus. Cardiovascular: Normal rate, regular rhythm. No edema Pulmonary/chest: Effort normal and breath sounds normal. No wheezing, rales or rhonchi. Abdominal: Soft, nondistended. Nontender. Bowel sounds active throughout. There are no masses palpable. No hepatomegaly. Rectal: Deferred Neurological: Alert and oriented to person place and time. Skin: Skin is warm and dry. No rashes noted.  Data Reviewed: I have personally reviewed following labs and imaging studies  CBC:    Latest Ref Rng & Units 09/06/2019    9:09 AM 08/24/2019    3:00 PM 06/27/2016    2:31 PM  CBC  WBC 4.0 - 10.5 K/uL 7.4  9.2  7.8   Hemoglobin 12.0 - 15.0 g/dL 13.7  14.0  14.2   Hematocrit 36.0 - 46.0 % 41.5  42.9  42.7   Platelets 150 - 400 K/uL 218  234  221     CMP:    Latest Ref Rng & Units 09/06/2019    9:09 AM 08/24/2019    3:00 PM 06/27/2016    2:31 PM  CMP  Glucose 70 - 99 mg/dL 110  115  91   BUN 6 - 20 mg/dL '9  11  13   '$ Creatinine 0.44 - 1.00 mg/dL 0.68  0.93  0.62   Sodium 135 - 145 mmol/L 142  141  140   Potassium 3.5 - 5.1  mmol/L 3.9  3.4  4.1   Chloride 98 - 111 mmol/L 110  109  105   CO2 22 - 32 mmol/L '23  23  22   '$ Calcium 8.9 - 10.3 mg/dL 9.1  9.3  9.2   Total Protein 6.5 - 8.1 g/dL 6.4   6.7   Total Bilirubin 0.3 - 1.2 mg/dL 0.7   0.4   Alkaline Phos 38 - 126 U/L 65   45   AST 15 - 41 U/L 28   14   ALT 0 - 44 U/L 63   17        Carmell Austria, MD 08/19/2022, 8:32 AM  Cc: Myrlene Broker, MD

## 2022-08-19 NOTE — Progress Notes (Signed)
A and O x3. Report to RN. Tolerated MAC anesthesia well.Teeth unchanged after procedure. 

## 2022-08-20 ENCOUNTER — Telehealth: Payer: Self-pay

## 2022-08-20 NOTE — Telephone Encounter (Signed)
  Follow up Call-     08/19/2022    7:49 AM  Call back number  Post procedure Call Back phone  # [phone]  Permission to leave phone message Yes     Patient questions:  Do you have a fever, pain , or abdominal swelling? No. Pain Score  0 *  Have you tolerated food without any problems? Yes.    Have you been able to return to your normal activities? Yes.    Do you have any questions about your discharge instructions: Diet   No. Medications  No. Follow up visit  No.  Do you have questions or concerns about your Care? No.  Actions: * If pain score is 4 or above: No action needed, pain <4.

## 2022-08-21 ENCOUNTER — Ambulatory Visit
Admission: RE | Admit: 2022-08-21 | Discharge: 2022-08-21 | Disposition: A | Discharge status: Home / Self Care | Payer: Managed Care, Other (non HMO) | Source: Ambulatory Visit | Attending: Obstetrics and Gynecology | Admitting: Obstetrics and Gynecology

## 2022-08-21 DIAGNOSIS — Z1231 Encounter for screening mammogram for malignant neoplasm of breast: Secondary | ICD-10-CM

## 2022-08-24 ENCOUNTER — Encounter: Payer: Self-pay | Admitting: Gastroenterology

## 2022-09-18 ENCOUNTER — Telehealth: Payer: Self-pay | Admitting: Hematology and Oncology

## 2022-09-18 NOTE — Telephone Encounter (Signed)
Patient called to schedule MD visit. Patient scheduled and notified.

## 2022-09-20 ENCOUNTER — Other Ambulatory Visit: Payer: Self-pay | Admitting: Hematology and Oncology

## 2022-09-20 ENCOUNTER — Inpatient Hospital Stay: Payer: Managed Care, Other (non HMO) | Attending: Hematology and Oncology

## 2022-09-20 ENCOUNTER — Inpatient Hospital Stay (HOSPITAL_BASED_OUTPATIENT_CLINIC_OR_DEPARTMENT_OTHER): Payer: Managed Care, Other (non HMO) | Admitting: Hematology and Oncology

## 2022-09-20 VITALS — BP 118/73 | HR 82 | Temp 98.4°F | Resp 16 | Wt 190.5 lb

## 2022-09-20 DIAGNOSIS — D6800 Von Willebrand disease, unspecified: Secondary | ICD-10-CM

## 2022-09-20 LAB — CMP (CANCER CENTER ONLY)
ALT: 29 U/L (ref 0–44)
AST: 17 U/L (ref 15–41)
Albumin: 4.4 g/dL (ref 3.5–5.0)
Alkaline Phosphatase: 46 U/L (ref 38–126)
Anion gap: 8 (ref 5–15)
BUN: 10 mg/dL (ref 6–20)
CO2: 24 mmol/L (ref 22–32)
Calcium: 10 mg/dL (ref 8.9–10.3)
Chloride: 107 mmol/L (ref 98–111)
Creatinine: 0.64 mg/dL (ref 0.44–1.00)
GFR, Estimated: >60 mL/min (ref >60)
Glucose, Bld: 96 mg/dL (ref 70–99)
Potassium: 3.9 mmol/L (ref 3.5–5.1)
Sodium: 139 mmol/L (ref 135–145)
Total Bilirubin: 0.5 mg/dL (ref 0.3–1.2)
Total Protein: 7 g/dL (ref 6.5–8.1)

## 2022-09-20 LAB — CBC WITH DIFFERENTIAL (CANCER CENTER ONLY)
Abs Immature Granulocytes: 0.03 10*3/uL (ref 0.00–0.07)
Basophils Absolute: 0.1 10*3/uL (ref 0.0–0.1)
Basophils Relative: 1 %
Eosinophils Absolute: 0.1 10*3/uL (ref 0.0–0.5)
Eosinophils Relative: 2 %
HCT: 41.4 % (ref 36.0–46.0)
Hemoglobin: 14 g/dL (ref 12.0–15.0)
Immature Granulocytes: 0 %
Lymphocytes Relative: 26 %
Lymphs Abs: 2.2 10*3/uL (ref 0.7–4.0)
MCH: 29.2 pg (ref 26.0–34.0)
MCHC: 33.8 g/dL (ref 30.0–36.0)
MCV: 86.4 fL (ref 80.0–100.0)
Monocytes Absolute: 0.6 10*3/uL (ref 0.1–1.0)
Monocytes Relative: 7 %
Neutro Abs: 5.4 10*3/uL (ref 1.7–7.7)
Neutrophils Relative %: 64 %
Platelet Count: 239 10*3/uL (ref 150–400)
RBC: 4.79 MIL/uL (ref 3.87–5.11)
RDW: 12.4 % (ref 11.5–15.5)
WBC Count: 8.4 10*3/uL (ref 4.0–10.5)
nRBC: 0 % (ref 0.0–0.2)

## 2022-09-20 LAB — APTT: aPTT: 26 seconds (ref 24–36)

## 2022-09-20 LAB — PROTIME-INR
INR: 1 (ref 0.8–1.2)
Prothrombin Time: 12.6 seconds (ref 11.4–15.2)

## 2022-09-20 NOTE — Progress Notes (Signed)
[facility] [phone]   [phone]  PROGRESS NOTE  Patient Care Team: Hadley Pen, MD as PCP - General (Family Medicine)  Hematological/Oncological History # Possible History of von Willebrand Syndrome 1) 04/30/2012: estabilshed care with Dr. Dalene Carrow at [facility]. Factor VIII of 68%, vWF antigen 73%, Ristocetin CoFactor 53%, vWF multimers:normal.  2) 05/26/2012: Factor VIII 128%, vWF antigen 122%, Ristocetin CoFactor 91%. Pre DDAVP dose. Patient had reaction to DDAVP (flushing, burning sensation. Received benadryl IV). Factor VIII increased to 289, vW antigen 212, Ristocetin 204. Subsequent check later that day showed Factor VIII at 59%.  3) Lost to follow up.  4) 06/26/2017: underwent plantar fascia release. Procedure was without bleeding complication.  4) 09/06/2019: establish care with Dr. Leonides Schanz prior to planned surgery on ankle fracture.   Interval History:  Ann Ramos 52 y.o. female with medical history significant for history of von Willebrand's disease who presents for a follow up visit. The patient's last visit was on 09/06/2019 at which time she established care. In the interim since the last visit she has had no major changes in her health.  On exam today [name] reports that she underwent her ankle surgery back in March 2021 without any complication.  She did not require any factor, blood transfusion, or any intervention afterwards.  She reports that she underwent a colonoscopy on 08/19/2022 and did not have any difficulty with that either.  She has had no issues with bleeding, bruising, or dark stools.  She has no planned upcoming surgeries.  She started no new medications and had no other changes in her health.  She reports she has had no illnesses other than COVID a few times in the last 2 years.  Her appetite is good and overall she is at her baseline level of health.  She denies any fevers, chills, sweats, nausea,  vomiting or diarrhea.  A full 10 point ROS is otherwise negative.  MEDICAL HISTORY:  Past Medical History:  Diagnosis Date   Allergy    Anemia    Arthritis    Asthma    Cancer (HCC)    stage one melanoma in June 2018   Gallstones    GERD (gastroesophageal reflux disease)    takes protonix   Hypertension    Interstitial cystitis    Obesity    Pancreatitis    Pneumonia    PONV (postoperative nausea and vomiting)    Von Willebrand disease (HCC) Type 2N    being followed for borderline status     SURGICAL HISTORY: Past Surgical History:  Procedure Laterality Date   CHOLECYSTECTOMY     Dilation and Cutterege     polys   MELANOMA EXCISION     left arm   ORIF ANKLE FRACTURE Right 09/09/2019   Procedure: Open Reduction Internal Fixation (ORIF) Right Ankle Trimalleolar Fracture;  Surgeon: Toni Arthurs, MD;  Location: [facility];  Service: Orthopedics;  Laterality: Right;   PLANTAR FASCIA RELEASE Left 06/26/2017   Procedure: PLANTAR FASCIA RELEASE;  Surgeon: Toni Arthurs, MD;  Location: [facility];  Service: Orthopedics;  Laterality: Left;   TUBAL LIGATION     uterine ablation      SOCIAL HISTORY: Social History   Socioeconomic History   Marital status: Married    Spouse name: Not on file   Number of children: 1   Years of education: Not on file   Highest education level: Not on file  Occupational  History   Not on file  Tobacco Use   Smoking status: Never   Smokeless tobacco: Never  Vaping Use   Vaping Use: Never used  Substance and Sexual Activity   Alcohol use: Yes    Comment: Occasional alcohol   Drug use: No   Sexual activity: Not on file  Other Topics Concern   Not on file  Social History Narrative   Not on file   Social Determinants of Health   Financial Resource Strain: Not on file  Food Insecurity: Not on file  Transportation Needs: Not on file  Physical Activity: Not on file  Stress: Not on file  Social Connections:  Not on file  Intimate Partner Violence: Not on file    FAMILY HISTORY: Family History  Problem Relation Age of Onset   Cancer Mother        Breast 2x   Hyperlipidemia Mother    Diabetes Father    Hyperlipidemia Father    Hypertension Father    Varicose Veins Father    Colon polyps Father    Bladder Cancer Father    Pancreatic cancer Maternal Grandfather    Breast cancer Maternal Aunt     ALLERGIES:  is allergic to other, adhesive [tape], and latex.  MEDICATIONS:  Current Outpatient Medications  Medication Sig Dispense Refill   cholecalciferol (VITAMIN D3) 25 MCG (1000 UNIT) tablet Take 1,000 Units by mouth daily.     DIGESTIVE ENZYMES PO Take 1 mg by mouth daily.     famotidine (PEPCID) 10 MG tablet Take 10 mg by mouth 2 (two) times daily.     levocetirizine (XYZAL) 5 MG tablet Take 5 mg by mouth daily.     mometasone (NASONEX) 50 MCG/ACT nasal spray Place 2 sprays into the nose daily.      olmesartan (BENICAR) 20 MG tablet Take 20 mg by mouth daily.     omeprazole (PRILOSEC) 20 MG capsule Take 1 capsule (20 mg total) by mouth daily. 30 capsule 6   progesterone (PROMETRIUM) 100 MG capsule Take 100 mg by mouth daily.     albuterol (VENTOLIN HFA) 108 (90 Base) MCG/ACT inhaler Inhale into the lungs every 6 (six) hours as needed for wheezing or shortness of breath. (Patient not taking: Reported on 09/20/2022)     tiZANidine (ZANAFLEX) 4 MG capsule Take 4 mg by mouth 3 (three) times daily as needed for muscle spasms.  (Patient not taking: Reported on 09/20/2022)  6   Current Facility-Administered Medications  Medication Dose Route Frequency Provider Last Rate Last Admin   0.9 %  sodium chloride infusion  500 mL Intravenous Once Lynann BolognaGupta, Rajesh, MD        REVIEW OF SYSTEMS:   Constitutional: ( - ) fevers, ( - )  chills , ( - ) night sweats Eyes: ( - ) blurriness of vision, ( - ) double vision, ( - ) watery eyes Ears, nose, mouth, throat, and face: ( - ) mucositis, ( - ) sore  throat Respiratory: ( - ) cough, ( - ) dyspnea, ( - ) wheezes Cardiovascular: ( - ) palpitation, ( - ) chest discomfort, ( - ) lower extremity swelling Gastrointestinal:  ( - ) nausea, ( - ) heartburn, ( - ) change in bowel habits Skin: ( - ) abnormal skin rashes Lymphatics: ( - ) new lymphadenopathy, ( - ) easy bruising Neurological: ( - ) numbness, ( - ) tingling, ( - ) new weaknesses Behavioral/Psych: ( - ) mood change, ( - )  new changes  All other systems were reviewed with the patient and are negative.  PHYSICAL EXAMINATION:  Vitals:   09/20/22 1500  BP: 118/73  Pulse: 82  Resp: 16  Temp: 98.4 F (36.9 C)  SpO2: 100%   Filed Weights   09/20/22 1500  Weight: 190 lb 8 oz (86.4 kg)    GENERAL: Well-appearing middle-aged Caucasian female, alert, no distress and comfortable SKIN: skin color, texture, turgor are normal, no rashes or significant lesions EYES: conjunctiva are pink and non-injected, sclera clear LUNGS: clear to auscultation and percussion with normal breathing effort HEART: regular rate & rhythm and no murmurs and no lower extremity edema Musculoskeletal: no cyanosis of digits and no clubbing  PSYCH: alert & oriented x 3, fluent speech NEURO: no focal motor/sensory deficits  LABORATORY DATA:  I have reviewed the data as listed    Latest Ref Rng & Units 09/20/2022    2:52 PM 09/06/2019    9:09 AM 08/24/2019    3:00 PM  CBC  WBC 4.0 - 10.5 K/uL 8.4  7.4  9.2   Hemoglobin 12.0 - 15.0 g/dL 16.1  09.6  04.5   Hematocrit 36.0 - 46.0 % 41.4  41.5  42.9   Platelets 150 - 400 K/uL 239  218  234        Latest Ref Rng & Units 09/20/2022    2:52 PM 09/06/2019    9:09 AM 08/24/2019    3:00 PM  CMP  Glucose 70 - 99 mg/dL 96  409  811   BUN 6 - 20 mg/dL 10  9  11    Creatinine 0.44 - 1.00 mg/dL 9.14  7.82  9.56   Sodium 135 - 145 mmol/L 139  142  141   Potassium 3.5 - 5.1 mmol/L 3.9  3.9  3.4   Chloride 98 - 111 mmol/L 107  110  109   CO2 22 - 32 mmol/L 24  23  23     Calcium 8.9 - 10.3 mg/dL 21.3  9.1  9.3   Total Protein 6.5 - 8.1 g/dL 7.0  6.4    Total Bilirubin 0.3 - 1.2 mg/dL 0.5  0.7    Alkaline Phos 38 - 126 U/L 46  65    AST 15 - 41 U/L 17  28    ALT 0 - 44 U/L 29  63      RADIOGRAPHIC STUDIES: No results found.  ASSESSMENT & PLAN Courteny Egler Faulks 52 y.o. female with medical history significant for history of von Willebrand's disease who presents for a follow up visit.  After review the labs and discussion with the patient the findings do not entirely support the diagnosis of mild hemophilia A or von Willebrand type 2N.  She is only had a single measurement of factor VIII which has been below the reference range of 40%.  Additionally she had normal levels of factor VIII when she arrived for her DDAVP testing.  Also reassuring is the fact that the patient has had numerous surgical procedures including wisdom teeth removal, cholecystectomy, and plantar fasciitis release without any bleeding complications.  This is often the best prognostic sign in a patient with a bleeding diathesis.   At this time I would recommend repeat testing of the factor VIII levels and von Willebrand panel.  In the event that the patient was found to have decreased factor VIII levels we could make recommendations regarding the use of recombinant factor VIII.  I would note that at this  time does not appear that this will be necessary prior to her planned procedure on 09/09/2019, but before formal recommendations could be made I would like to have the above labs return.  We would recommend the patient continue to follow in our clinic at least once yearly and that she be referred to us promptly in the event that the planned surgery is considered.   #Low Factor VIII Levels --after review of the labs it appears the patient has a lower level of FVIII, but not low enough to be considered mild Hemophilia A. --prior labs were no consistent with vWD, though a type 2N is a possibility. Will  repeat vWD tests today, including a [facility] Sendout for ZOX:WRUEAvWF:FVIII binding --patient has undergone numerous prior surgeries without bleeding complications and requiring factor repletion. This is reassuring for future procedures. --if Factor VIII levels are >40% there is no clear indication for supplemental factor in the peri-operative setting. I would recommend FVIII be on standby in the event a bleeding complication did develop in the perioperative period.  --If vWD Type 2N is present the treatment is similar to FVIII deficiency alone, which is repletion of FVIII levels with recombinant factor  --await the results of the vWD/ Factor VIII levels prior to clearance and formal recommendations for surgery. Please contact our service prior to surgery to assure the labs have returned and are adequate for treatment.  --RTC as soon as possible prior to a scheduled surgery.  No orders of the defined types were placed in this encounter.   All questions were answered. The patient knows to call the clinic with any problems, questions or concerns.  A total of more than 30 minutes were spent on this encounter with face-to-face time and non-face-to-face time, including preparing to see the patient, ordering tests and/or medications, counseling the patient and coordination of care as outlined above.   Ulysees BarnsJohn T. Cameryn Schum, MD Department of Hematology/Oncology [facility] Phone: [phone] Pager: [phone] Email: [email or web address]  09/29/2022 8:11 PM

## 2022-09-23 LAB — COAG STUDIES INTERP REPORT

## 2022-09-23 LAB — VON WILLEBRAND PANEL
Coagulation Factor VIII: 148 % — ABNORMAL HIGH (ref 56–140)
Ristocetin Co-factor, Plasma: 94 % (ref 50–200)
Von Willebrand Antigen, Plasma: 145 % (ref 50–200)

## 2022-09-30 ENCOUNTER — Telehealth: Payer: Self-pay | Admitting: *Deleted

## 2022-09-30 NOTE — Telephone Encounter (Signed)
TCT patient regarding recent labs results. Spoke with her. Advised that her von Willebrand levels remain within normal limits. This is reassuring. Moving forward I would recommend that she check in with our clinic prior to any planned surgeries or dental work for [place] to reassess her levels. It is entirely possible that she no longer has von Willebrand's disease as many patients have increasing levels with age and grow out of it.  Pt voiced understanding. No questions or concerns.

## 2022-09-30 NOTE — Telephone Encounter (Signed)
-----   Message from Jaci Standard, MD sent at 09/29/2022  8:11 PM EDT ----- Please let Ann Ramos know that her von Willebrand levels remain within normal limits.  This is reassuring.  Moving forward I would recommend that she check in with our clinic prior to any planned surgeries or dental work for [place] to reassess her levels.  It is entirely possible that she no longer has von Willebrand's disease as many patients have increasing levels with age and grow out of it.  ----- Message ----- From: Leory Plowman, Lab In [place] Sent: 09/20/2022   2:55 PM EDT To: Jaci Standard, MD

## 2023-09-04 ENCOUNTER — Other Ambulatory Visit: Payer: Self-pay | Admitting: Obstetrics and Gynecology

## 2023-09-04 DIAGNOSIS — Z1231 Encounter for screening mammogram for malignant neoplasm of breast: Secondary | ICD-10-CM

## 2023-09-16 ENCOUNTER — Ambulatory Visit
Admission: RE | Admit: 2023-09-16 | Discharge: 2023-09-16 | Disposition: A | Discharge status: Home / Self Care | Source: Ambulatory Visit | Attending: Obstetrics and Gynecology | Admitting: Obstetrics and Gynecology

## 2023-09-16 DIAGNOSIS — Z1231 Encounter for screening mammogram for malignant neoplasm of breast: Secondary | ICD-10-CM

## 2023-09-19 ENCOUNTER — Other Ambulatory Visit: Payer: Self-pay | Admitting: Obstetrics and Gynecology

## 2023-09-19 DIAGNOSIS — R928 Other abnormal and inconclusive findings on diagnostic imaging of breast: Secondary | ICD-10-CM

## 2023-10-01 ENCOUNTER — Ambulatory Visit
Admission: RE | Admit: 2023-10-01 | Discharge: 2023-10-01 | Disposition: A | Discharge status: Home / Self Care | Source: Ambulatory Visit | Attending: Obstetrics and Gynecology | Admitting: Obstetrics and Gynecology

## 2023-10-01 DIAGNOSIS — R928 Other abnormal and inconclusive findings on diagnostic imaging of breast: Secondary | ICD-10-CM

## 2023-10-14 ENCOUNTER — Other Ambulatory Visit: Payer: Self-pay | Admitting: Obstetrics and Gynecology

## 2023-10-14 DIAGNOSIS — Z1239 Encounter for other screening for malignant neoplasm of breast: Secondary | ICD-10-CM

## 2023-10-17 IMAGING — MR MR BREAST BILAT WO/W CM
8 of 12 series · 33 of 48 positions shown · IV contrast (9 ml gadavist)
Comparison: Previous exams.

CLINICAL DATA: High risk screening breast MRI. Family history
breast cancer in her mother diagnosed at age 40 and 66 as well as
Aunt at age 42. History of melanoma.

EXAM:
BILATERAL BREAST MRI WITH AND WITHOUT CONTRAST
TECHNIQUE: Multiplanar, multisequence MR images of both breasts were obtained
prior to and following the intravenous administration of 9 ml of
Gadavist.

[Series 2: t2_tirm_tra ipat (a-p) · axial · 3.0mm · 0.70mm/px · 1 of 55 slices shown]
[im 1/55]
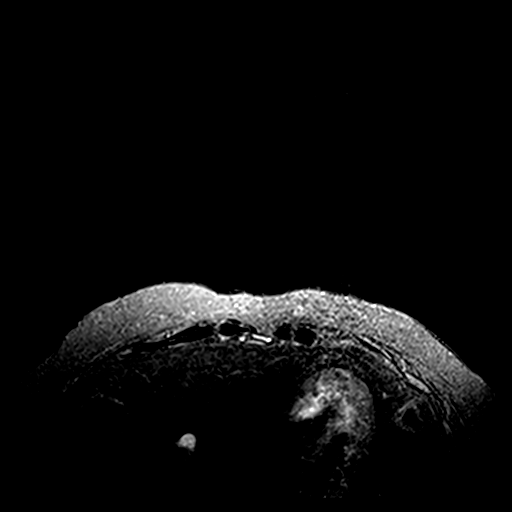

[Series 3: fl3d pre-cm no · axial · non-contrast · 1.2mm · 0.89mm/px · z∈[-48,+123]mm · 5 of 144 slices shown]
[im 1/144]
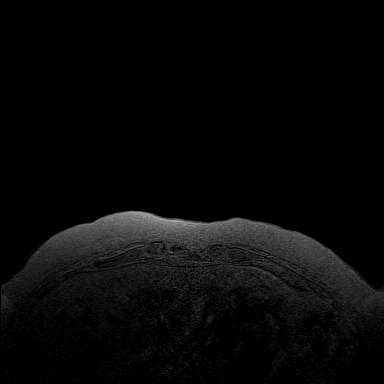
[im 36/144]
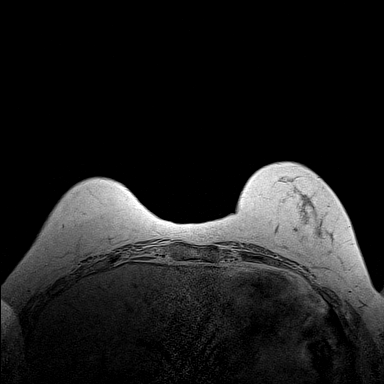
[im 72/144]
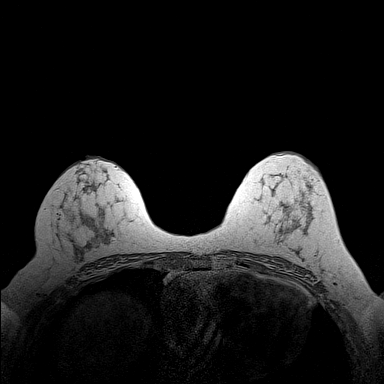
[im 108/144]
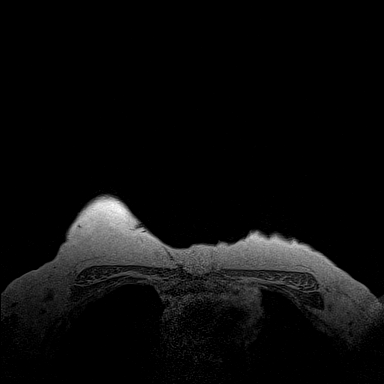
[im 144/144]
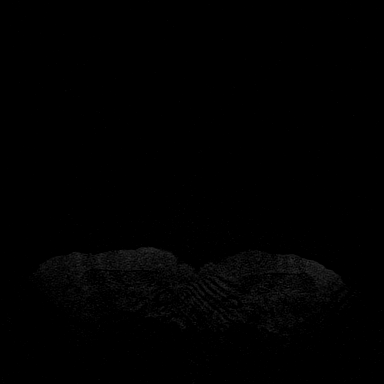

[Series 4: fl3d pre-cm · axial · non-contrast · 1.2mm · 0.89mm/px · z∈[-48,+123]mm · 5 of 144 slices shown]
[im 1/144]
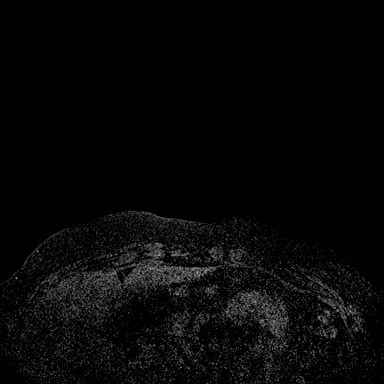
[im 36/144]
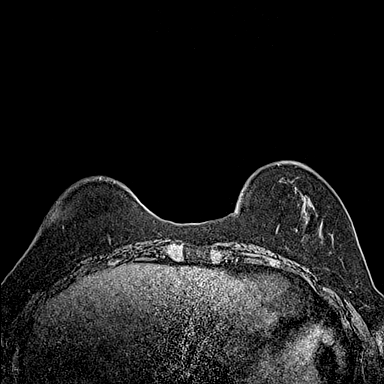
[im 72/144]
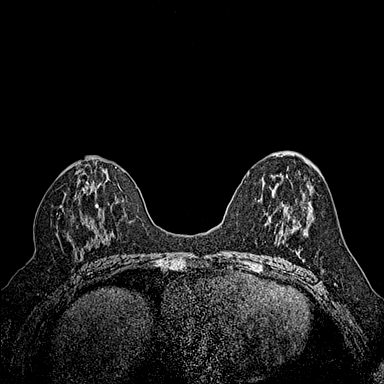
[im 108/144]
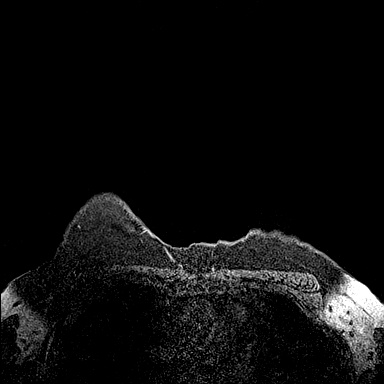
[im 144/144]
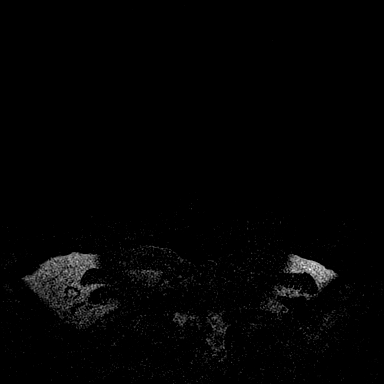

[Series 5: fl3d post-cm 20 · axial · 1.2mm · 0.89mm/px · z∈[-48,+123]mm · 5 of 144 slices shown (1 of 3)]
[im 1/144]
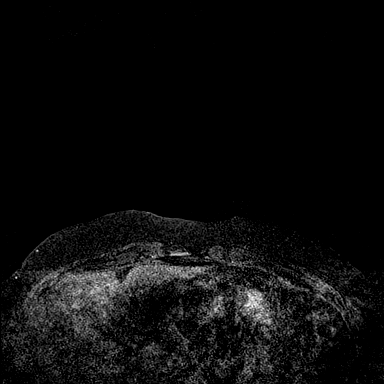
[im 36/144]
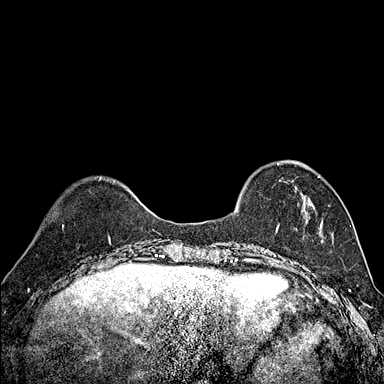
[im 72/144]
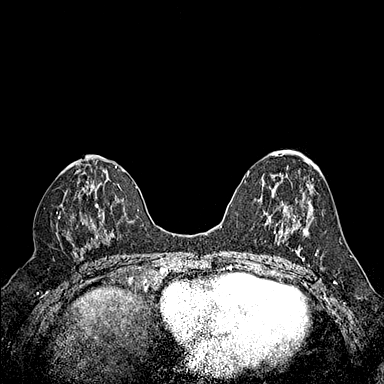
[im 108/144]
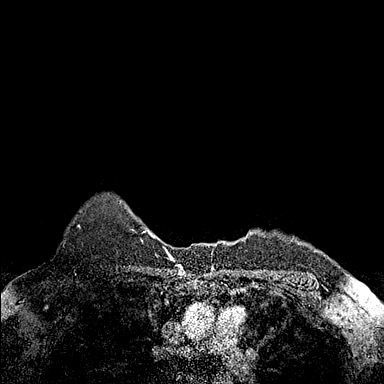
[im 144/144]
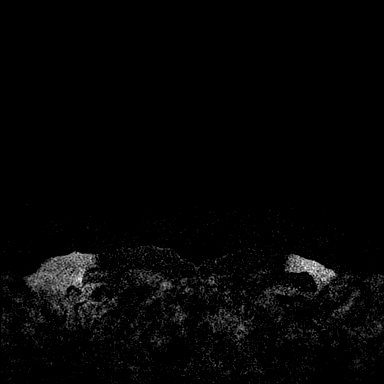

[Series 6: fl3d post-cm 20 · axial · 1.2mm · 0.89mm/px · z∈[-48,+123]mm · 5 of 144 slices shown (2 of 3)]
[im 1/144]
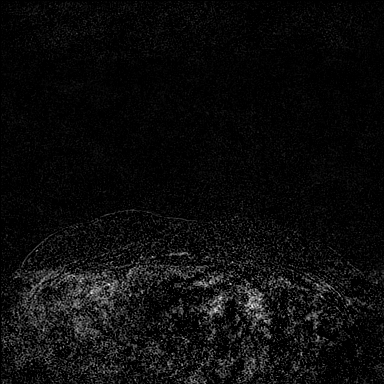
[im 36/144]
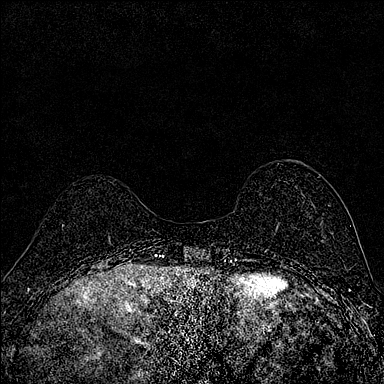
[im 72/144]
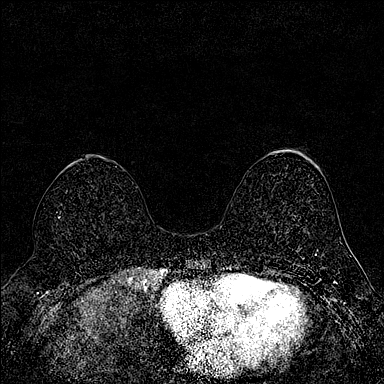
[im 108/144]
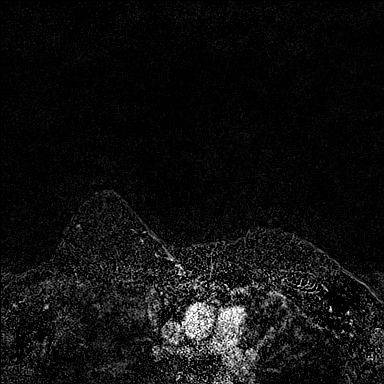
[im 144/144]
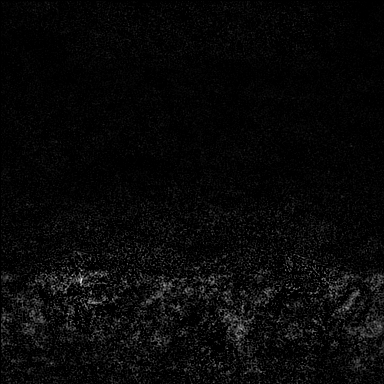

[Series 7: fl3d post-cm 20 · axial · 172.8mm · 0.89mm/px · 1 of 1 slices shown (3 of 3)]
[im 1/1]
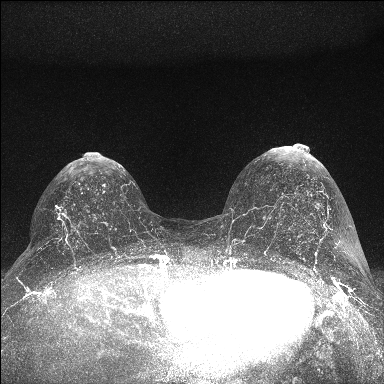

[Series 8: fl3d post-cm 3 · axial · 1.2mm · 0.89mm/px · z∈[-48,+123]mm · 6 of 144 slices shown (1 of 2)]
[im 1/144]
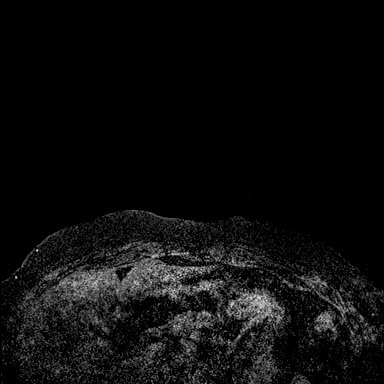
[im 29/144]
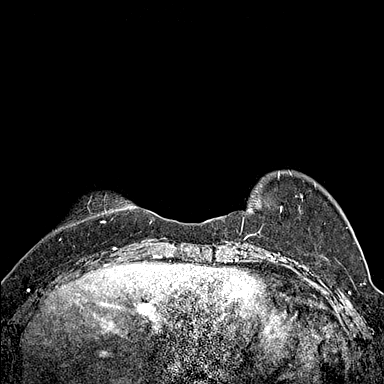
[im 58/144]
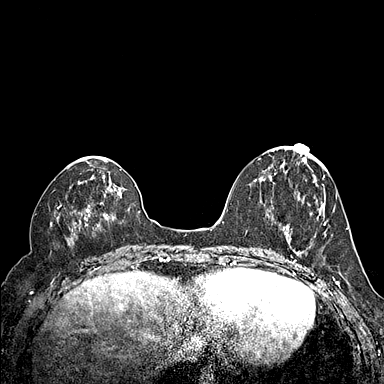
[im 86/144]
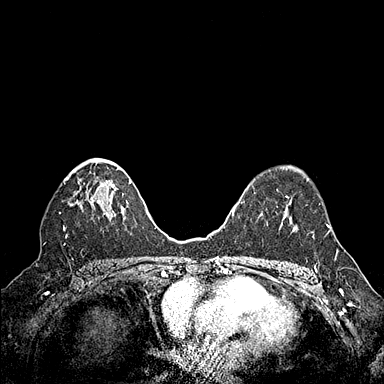
[im 115/144]
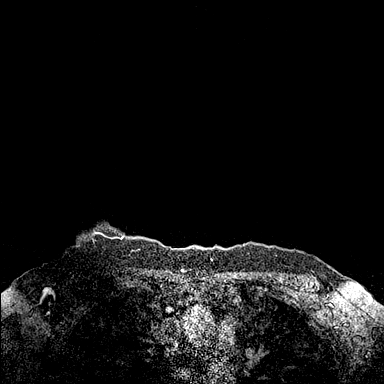
[im 144/144]
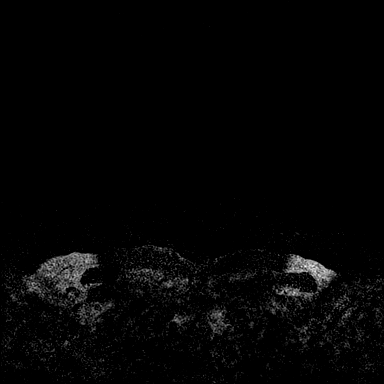

[Series 9: fl3d post-cm 3 · axial · 1.2mm · 0.89mm/px · z∈[-48,+88]mm · 5 of 144 slices shown (2 of 2)]
[im 1/144]
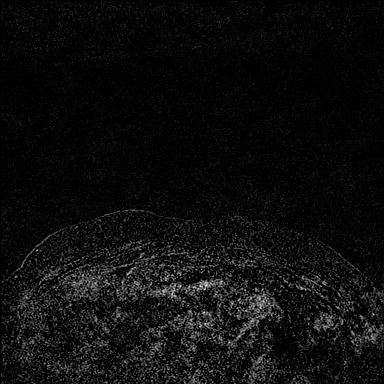
[im 29/144]
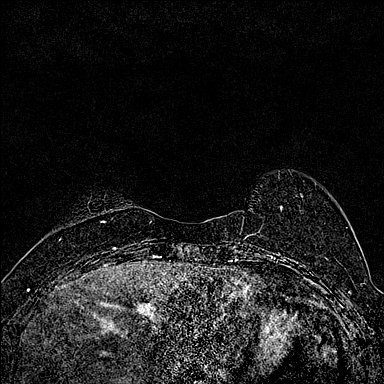
[im 58/144]
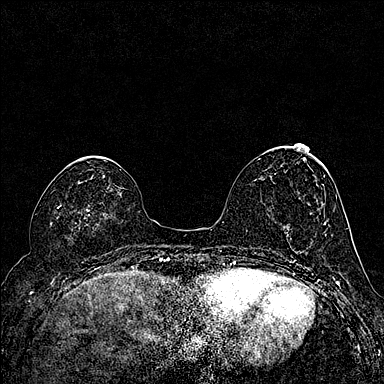
[im 86/144]
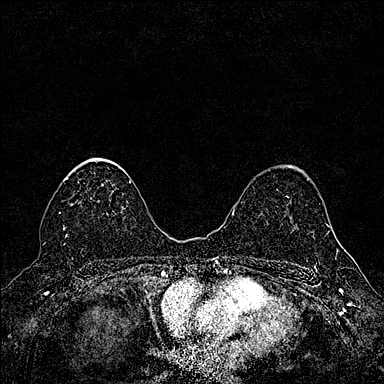
[im 115/144]
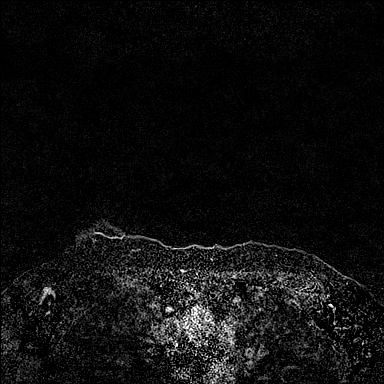

[33 of 48 positions shown; findings below may reference images not displayed]

Three-dimensional MR images were rendered by post-processing of the
original MR data on an independent workstation. The
three-dimensional MR images were interpreted, and findings are
reported in the following complete MRI report for this study. Three
dimensional images were evaluated at the independent interpreting
workstation using the DynaCAD thin client.
Prior MRI from 9771 is available,
although previous images of MRI from 7803 not available.
FINDINGS: Breast composition: b. Scattered fibroglandular tissue.

Background parenchymal enhancement: Moderate.

Right breast: No suspicious mass or abnormal enhancement.

Left breast: No suspicious mass or abnormal enhancement. Oval 7 mm
enhancing mass over the middle third outer lower left breast
unchanged from 9771.

Lymph nodes: No abnormal appearing lymph nodes.

Ancillary findings:  None.
IMPRESSION: No evidence of malignancy within either breast.

RECOMMENDATION:
Recommend continued annual bilateral 3D screening mammographic
follow-up as well as continued annual high risk screening breast
MRI.

The American Cancer Society recommends annual MRI and mammography in
patients with an estimated lifetime risk of developing breast cancer
greater than 20 - 25%, or who are known or suspected to be positive
for the breast cancer gene.

BI-RADS CATEGORY  2: Benign.

## 2024-03-22 ENCOUNTER — Other Ambulatory Visit

## 2024-03-23 ENCOUNTER — Ambulatory Visit
Admission: RE | Admit: 2024-03-23 | Discharge: 2024-03-23 | Disposition: A | Discharge status: Home / Self Care | Source: Ambulatory Visit | Attending: Obstetrics and Gynecology | Admitting: Obstetrics and Gynecology

## 2024-03-23 DIAGNOSIS — Z1239 Encounter for other screening for malignant neoplasm of breast: Secondary | ICD-10-CM

## 2024-03-23 MED ORDER — GADOPICLENOL 0.5 MMOL/ML IV SOLN
8.0000 mL | Freq: Once | INTRAVENOUS | Status: AC | PRN
  Administered 2024-03-23: 8 mL via INTRAVENOUS

## 2024-05-20 ENCOUNTER — Encounter: Payer: Self-pay | Admitting: Allergy and Immunology

## 2024-05-20 ENCOUNTER — Ambulatory Visit: Admitting: Allergy and Immunology

## 2024-05-20 VITALS — BP 118/84 | HR 80 | Resp 16 | Ht 64.5 in | Wt 169.2 lb

## 2024-05-20 DIAGNOSIS — J3089 Other allergic rhinitis: Secondary | ICD-10-CM

## 2024-05-20 DIAGNOSIS — J4520 Mild intermittent asthma, uncomplicated: Secondary | ICD-10-CM

## 2024-05-20 DIAGNOSIS — H04123 Dry eye syndrome of bilateral lacrimal glands: Secondary | ICD-10-CM

## 2024-05-20 DIAGNOSIS — K219 Gastro-esophageal reflux disease without esophagitis: Secondary | ICD-10-CM | POA: Diagnosis not present

## 2024-05-20 DIAGNOSIS — G43909 Migraine, unspecified, not intractable, without status migrainosus: Secondary | ICD-10-CM | POA: Diagnosis not present

## 2024-05-20 MED ORDER — PANTOPRAZOLE SODIUM 40 MG PO TBEC: 40.0000 mg | DELAYED_RELEASE_TABLET | Freq: Every day | ORAL | 5 refills | Status: AC

## 2024-05-20 MED ORDER — RYALTRIS 665-25 MCG/ACT NA SUSP: NASAL | 5 refills | Status: DC

## 2024-05-20 MED ORDER — AIRSUPRA 90-80 MCG/ACT IN AERO: 2.0000 | INHALATION_SPRAY | RESPIRATORY_TRACT | 1 refills | Status: AC | PRN

## 2024-05-20 MED ORDER — RYALTRIS 665-25 MCG/ACT NA SUSP: NASAL | 5 refills | Status: AC

## 2024-05-20 NOTE — Progress Notes (Signed)
 [place] - [place] - [place] - [place] - [place]   Dear Dr. Silver,  Thank you for referring Ann Ramos to the [facility] [place]  on 05/20/2024.   Below is a summation of this patient's evaluation and recommendations.  Thank you for your referral. I will keep you informed about this patient's response to treatment.   If you have any questions please do not hesitate to contact me.   Sincerely,  Camellia DOROTHA Denis, MD Allergy / Immunology [place] [facility] [place]    ______________________________________________________________________    NEW PATIENT NOTE  Referring Provider: Silver Lamar LABOR, MD Primary Provider: Silver Lamar LABOR, MD Date of office visit: 05/20/2024    Subjective:   Chief Complaint:  Ann Ramos (DOB: [birth year]) is a 53 y.o. female who presents to the clinic on 05/20/2024 with a chief complaint of Allergic Rhinitis  .     HPI: Mercy presents to this clinic in evaluation of allergies.  Apparently I had seen her in this clinic over a decade ago for similar issues.  She notes nasal congestion and sneezing without any anosmia that has been a longstanding issue but is much worse over the course of the past year.  This does not appear to correlate with any new environmental exposures.  This is not really responding to the treatment she has administered in the past which has included nasal steroids and antihistamines and occasionally injected steroids.  She had an injected steroid 1 month ago and she is not really sure it helped her very much.  She has headaches.  About twice a week she gets a facial headache involving her frontal region and her cheeks that is constant.  It can randomly occur throughout the day.  Can last the whole day.  It is not associated with any scotoma or dizziness or other neurological symptoms.  When she takes Tylenol  it does not really help.  She has been stuck  in this pattern for years.  She has no problems with sleep.  She drinks a coffee in the morning and has tea about twice a week and chocolate about twice a week.  She has constant throat clearing especially when she wakes up in the morning to clear out her throat.  She has a cough in the morning and a coating in her throat.  She does have a history of reflux with regurgitation and burning in her throat but that is actually been under control without any medications for the past 2 years.  Occasionally she will take Pepcid less than 1 time per month.  She occasionally gets sinusitis.  And this leads to the development of a cough for which she has been given a short acting bronchodilator.  Rarely does she use this medication averaging out about twice a year.  She does not have any cold air induced bronchospastic symptoms or exercise-induced bronchospastic symptoms.  She has seen ENT in the past and had a CT scan of her sinuses in 2023 which did not identify any chronic sinusitis.  Past Medical History:  Diagnosis Date   Allergy    Anemia    Arthritis    Asthma    Cancer (HCC)    stage one melanoma in June 2018   Gallstones    GERD (gastroesophageal reflux disease)    takes protonix    Hypertension    Interstitial cystitis    Obesity    Pancreatitis  Pneumonia    PONV (postoperative nausea and vomiting)    Von Willebrand disease (HCC) Type 2N    being followed for borderline status     Past Surgical History:  Procedure Laterality Date   CHOLECYSTECTOMY     Dilation and Cutterege     polys   MELANOMA EXCISION     left arm   ORIF ANKLE FRACTURE Right 09/09/2019   Procedure: Open Reduction Internal Fixation (ORIF) Right Ankle Trimalleolar Fracture;  Surgeon: Kit Rush, MD;  Location: [facility];  Service: Orthopedics;  Laterality: Right;   PLANTAR FASCIA RELEASE Left 06/26/2017   Procedure: PLANTAR FASCIA RELEASE;  Surgeon: Kit Rush, MD;  Location: [facility];  Service: Orthopedics;  Laterality: Left;   TUBAL LIGATION     uterine ablation      Allergies as of 05/20/2024       Reactions   Other Other (See Comments)   DDAVP  has caused pt to have itching and burning of her eyes and scalp. Does not tolerate   Adhesive [tape] Dermatitis   Latex Dermatitis        Medication List    albuterol 108 (90 Base) MCG/ACT inhaler Commonly known as: VENTOLIN HFA Inhale into the lungs every 6 (six) hours as needed for wheezing or shortness of breath.   fluticasone 50 MCG/ACT nasal spray Commonly known as: FLONASE Administer 1 spray into each nostril daily.   levocetirizine 5 MG tablet Commonly known as: XYZAL Take 5 mg by mouth daily.   Magnesium Glycinate 120 MG Caps Take by mouth in the morning and at bedtime.   olmesartan 20 MG tablet Commonly known as: BENICAR Take 20 mg by mouth daily.   PROBIOTIC DAILY PO Take by mouth daily.   tiZANidine 4 MG tablet Commonly known as: ZANAFLEX Take 6 mg by mouth as needed for muscle spasms.   Xiidra 5 % Soln Generic drug: Lifitegrast Place 1 drop into both eyes twice daily as directed.    Review of systems negative except as noted in HPI / PMHx or noted below:  Review of Systems  Constitutional: Negative.   HENT: Negative.    Eyes: Negative.   Respiratory: Negative.    Cardiovascular: Negative.   Gastrointestinal: Negative.   Genitourinary: Negative.   Musculoskeletal: Negative.   Skin: Negative.   Neurological: Negative.   Endo/Heme/Allergies: Negative.   Psychiatric/Behavioral: Negative.      Family History  Problem Relation Age of Onset   Hypertension Mother    Breast cancer Mother 6       RECUR 66   Hyperlipidemia Mother    Asthma Father    Allergic rhinitis Father    Diabetes Father    Hyperlipidemia Father    Hypertension Father    Varicose Veins Father    Colon polyps Father    Bladder Cancer Father    Breast cancer Maternal Aunt 43   Pancreatic  cancer Maternal Grandfather    Allergic rhinitis Paternal Grandfather     Social History   Socioeconomic History   Marital status: Married    Spouse name: Not on file   Number of children: 1   Years of education: Not on file   Highest education level: Not on file  Occupational History   Not on file  Tobacco Use   Smoking status: Never   Smokeless tobacco: Never  Vaping Use   Vaping status: Never Used  Substance and Sexual Activity   Alcohol use: Yes  Comment: Occasional alcohol   Drug use: No   Sexual activity: Not on file  Other Topics Concern   Not on file  Social History Narrative   Not on file   Social Drivers of Health   Financial Resource Strain: Not on file  Food Insecurity: Low Risk  (12/18/2023)   Received from [facility]   Hunger Vital Sign    Within the past 12 months, you worried that your food would run out before you got money to buy more: Never true    Within the past 12 months, the food you bought just didn't last and you didn't have money to get more. : Never true  Transportation Needs: No Transportation Needs (12/18/2023)   Received from [facility]    In the past 12 months, has lack of reliable transportation kept you from medical appointments, meetings, work or from getting things needed for daily living? : No  Physical Activity: Not on file  Stress: Not on file  Social Connections: Not on file  Intimate Partner Violence: Not on file    Environmental and Social history  Lives in a house with a dry environment, dogs located inside the household, no carpet in the bedroom, no plastic on the bed, no plastic on the pillow, no smoking ongoing with inside the household, an appointment performing office work.  Objective:   Vitals:   05/20/24 0920 05/20/24 0952  BP: (!) 130/92 118/84  Pulse: 80   Resp: 16   SpO2: 99%    Height: 5' 4.5 (163.8 cm) Weight: 169 lb 3.2 oz (76.7 kg)  Physical Exam Constitutional:       Appearance: She is not diaphoretic.     Comments: Raspy voice, throat clearing  HENT:     Head: Normocephalic.     Right Ear: Tympanic membrane, ear canal and external ear normal.     Left Ear: Tympanic membrane, ear canal and external ear normal.     Nose: Nose normal. No mucosal edema or rhinorrhea.     Mouth/Throat:     Pharynx: Uvula midline. No oropharyngeal exudate.  Eyes:     Conjunctiva/sclera: Conjunctivae normal.  Neck:     Thyroid: No thyromegaly.     Trachea: Trachea normal. No tracheal tenderness or tracheal deviation.  Cardiovascular:     Rate and Rhythm: Normal rate and regular rhythm.     Heart sounds: Normal heart sounds, S1 normal and S2 normal. No murmur heard. Pulmonary:     Effort: No respiratory distress.     Breath sounds: Normal breath sounds. No stridor. No wheezing or rales.  Lymphadenopathy:     Head:     Right side of head: No tonsillar adenopathy.     Left side of head: No tonsillar adenopathy.     Cervical: No cervical adenopathy.  Skin:    Findings: No erythema or rash.     Nails: There is no clubbing.  Neurological:     Mental Status: She is alert.     Diagnostics: Allergy skin tests were not performed.   Spirometry was performed and demonstrated an FEV1 of 2.76 @ 101 % of predicted. FEV1/FVC = 0.80  Results of a sinus CT scan obtained 31 Oct 2021 identifies the following:  Paranasal sinuses:  Frontal: A very small portion of the left frontal sinus is excluded  from the field of view superiorly. Normally aerated. Patent frontal  sinus drainage pathways.  Ethmoid: Normally aerated.  Maxillary: Normally aerated.  Sphenoid: Normally aerated. Patent sphenoethmoidal recesses.  Right ostiomeatal unit: Patent.  Left ostiomeatal unit: Patent. Nasal passages: Patent. Leftward deviation and leftward bony  spurring of the nasal septum.  Anatomy: Pneumatization is present superior to the anterior ethmoid  notch on the right. Symmetric and intact  olfactory grooves and fovea  ethmoidalis, Keros II (4-75mm). Sellar sphenoid pneumatization  pattern.   Results of blood tests obtained 20 September 2022 identifies WBC 8.4, absolute eosinophil 100, absolute lymphocyte 2200, hemoglobin 14.0, platelet 239   Assessment and Plan:    1. Perennial allergic rhinitis   2. Asthma, mild intermittent, well-controlled   3. Migraine syndrome   4. LPRD (laryngopharyngeal reflux disease)   5. Dry eye syndrome of both eyes     1. Return to clinic for skin testing (no antihistamines)   2. Treat and prevent inflammation of airway:   A. Ryaltris  - 2 sprays each nostril 1 time per day   3. Treat and prevent reflux induced inflammation or airway:   A. Minimize caffeine / chocolate consumption  B. Replace all throat clearing with swallowing/drinking maneuver  C. Pantoprazole  40 mg - 1 tablet 1 time per day  4. Treat and prevent dry eye:   A. Avoid all oral antihistamines  B. Continue Xiidra  C. OTC Systane eye drops  5. Treat and prevent headaches:   A. Minimize caffeine / chocolate consumption  6. If needed:   A. Nasal saline  B. Airsupra  - 2 inhalations every 4-6 hours (replaces albuterol)   7. Immunotherapy???  8. Influenza = tamiflu. Covid = paxlovid  Angie appears to have significant irritation and inflammation of her airway most likely secondary to a combination of atopic disease and reflux induced respiratory disease and we will further define those issues when she returns to this clinic for skin testing.  If she does demonstrate significant atopic disease she would be a candidate for immunotherapy given the fact that she has already failed medical therapy in the past.  She will consistently perform therapy directed against LPR as directed above.  Because of her dry eye syndrome she should avoid all oral antihistamines.  She appears to have very common headaches and I have asked her to taper down all of her caffeine and chocolate  consumption and we will further evaluate this issue in the future pending her response to this simple step.  Will see her back in this clinic for skin testing.  Camellia DOROTHA Denis, MD Allergy / Immunology [facility] [place] 

## 2024-05-20 NOTE — Patient Instructions (Addendum)
  1. Return to clinic for skin testing (no antihistamines)   2. Treat and prevent inflammation of airway:   A. Ryaltris - 2 sprays each nostril 1 time per day   3. Treat and prevent reflux induced inflammation or airway:   A. Minimize caffeine / chocolate consumption  B. Replace all throat clearing with swallowing/drinking maneuver  C. Pantoprazole 40 mg - 1 tablet 1 time per day  4. Treat and prevent dry eye:   A. Avoid all oral antihistamines  B. Continue Xiidra  C. OTC Systane eye drops  5. Treat and prevent headaches:   A. Minimize caffeine / chocolate consumption  6. If needed:   A. Nasal saline  B. Airsupra - 2 inhalations every 4-6 hours (replaces albuterol)   7. Immunotherapy???  8. Influenza = tamiflu. Covid = paxlovid

## 2024-05-24 ENCOUNTER — Encounter: Payer: Self-pay | Admitting: Allergy and Immunology

## 2024-05-27 ENCOUNTER — Ambulatory Visit: Admitting: Allergy and Immunology

## 2024-05-27 DIAGNOSIS — J3089 Other allergic rhinitis: Secondary | ICD-10-CM | POA: Diagnosis not present

## 2024-05-31 ENCOUNTER — Encounter: Payer: Self-pay | Admitting: Allergy and Immunology

## 2024-05-31 NOTE — Progress Notes (Signed)
 Ann Ramos returns to this clinic for skin testing.  Allergy skin testing did not identify any hypersensitivity against a screening panel of aeroallergens.

## 2024-06-30 ENCOUNTER — Ambulatory Visit: Admitting: Allergy and Immunology
